# Patient Record
Sex: Female | Born: 1935 | Race: White | Hispanic: No | Marital: Married | Smoking: Never smoker
Health system: Southern US, Community
[De-identification: ages and names within clinical notes are randomized; demographics above are authoritative.]

## PROBLEM LIST (undated history)

## (undated) DIAGNOSIS — M5432 Sciatica, left side: Secondary | ICD-10-CM

## (undated) DIAGNOSIS — M25473 Effusion, unspecified ankle: Secondary | ICD-10-CM

## (undated) DIAGNOSIS — R51 Headache: Secondary | ICD-10-CM

## (undated) DIAGNOSIS — I10 Essential (primary) hypertension: Secondary | ICD-10-CM

## (undated) DIAGNOSIS — R609 Edema, unspecified: Secondary | ICD-10-CM

## (undated) DIAGNOSIS — IMO0001 Reserved for inherently not codable concepts without codable children: Secondary | ICD-10-CM

## (undated) DIAGNOSIS — Z8673 Personal history of transient ischemic attack (TIA), and cerebral infarction without residual deficits: Secondary | ICD-10-CM

## (undated) DIAGNOSIS — F419 Anxiety disorder, unspecified: Secondary | ICD-10-CM

## (undated) DIAGNOSIS — F331 Major depressive disorder, recurrent, moderate: Secondary | ICD-10-CM

## (undated) DIAGNOSIS — F0150 Vascular dementia without behavioral disturbance: Secondary | ICD-10-CM

## (undated) DIAGNOSIS — I2510 Atherosclerotic heart disease of native coronary artery without angina pectoris: Secondary | ICD-10-CM

## (undated) DIAGNOSIS — I2583 Coronary atherosclerosis due to lipid rich plaque: Secondary | ICD-10-CM

## (undated) DIAGNOSIS — N289 Disorder of kidney and ureter, unspecified: Secondary | ICD-10-CM

## (undated) DIAGNOSIS — Z5189 Encounter for other specified aftercare: Secondary | ICD-10-CM

## (undated) DIAGNOSIS — E7800 Pure hypercholesterolemia, unspecified: Secondary | ICD-10-CM

## (undated) DIAGNOSIS — IMO0002 Reserved for concepts with insufficient information to code with codable children: Secondary | ICD-10-CM

## (undated) HISTORY — PX: POLYPECTOMY: SHX149

## (undated) HISTORY — DX: Atherosclerotic heart disease of native coronary artery without angina pectoris: I25.10

## (undated) HISTORY — DX: Coronary atherosclerosis due to lipid rich plaque: I25.83

## (undated) HISTORY — PX: ABDOMINAL HYSTERECTOMY: SHX81

## (undated) HISTORY — DX: Effusion, unspecified ankle: M25.473

## (undated) HISTORY — PX: DILATION AND CURETTAGE OF UTERUS: SHX78

## (undated) HISTORY — DX: Vascular dementia without behavioral disturbance: F01.50

## (undated) HISTORY — DX: Major depressive disorder, recurrent, moderate: F33.1

## (undated) HISTORY — DX: Reserved for concepts with insufficient information to code with codable children: IMO0002

## (undated) HISTORY — PX: COLONOSCOPY: SHX174

## (undated) HISTORY — DX: Personal history of transient ischemic attack (TIA), and cerebral infarction without residual deficits: Z86.73

---

## 2001-05-15 ENCOUNTER — Encounter: Payer: Self-pay | Admitting: Obstetrics and Gynecology

## 2001-05-15 ENCOUNTER — Ambulatory Visit (HOSPITAL_COMMUNITY): Admission: RE | Admit: 2001-05-15 | Discharge: 2001-05-15 | Payer: Self-pay | Admitting: Obstetrics and Gynecology

## 2002-05-15 ENCOUNTER — Ambulatory Visit (HOSPITAL_COMMUNITY): Admission: RE | Admit: 2002-05-15 | Discharge: 2002-05-15 | Payer: Self-pay | Admitting: Unknown Physician Specialty

## 2002-05-15 ENCOUNTER — Encounter: Payer: Self-pay | Admitting: Unknown Physician Specialty

## 2004-07-20 ENCOUNTER — Ambulatory Visit: Payer: Self-pay | Admitting: Family Medicine

## 2005-11-10 ENCOUNTER — Ambulatory Visit: Payer: Self-pay | Admitting: Internal Medicine

## 2006-07-03 ENCOUNTER — Ambulatory Visit: Payer: Self-pay | Admitting: Internal Medicine

## 2006-07-20 ENCOUNTER — Ambulatory Visit: Payer: Self-pay | Admitting: Internal Medicine

## 2008-08-07 ENCOUNTER — Ambulatory Visit (HOSPITAL_COMMUNITY): Admission: RE | Admit: 2008-08-07 | Discharge: 2008-08-07 | Payer: Self-pay | Admitting: Ophthalmology

## 2008-08-20 ENCOUNTER — Ambulatory Visit (HOSPITAL_COMMUNITY): Admission: RE | Admit: 2008-08-20 | Discharge: 2008-08-20 | Payer: Self-pay | Admitting: Ophthalmology

## 2010-03-02 ENCOUNTER — Ambulatory Visit (HOSPITAL_COMMUNITY): Admission: RE | Admit: 2010-03-02 | Discharge: 2010-03-02 | Payer: Self-pay | Admitting: *Deleted

## 2010-11-01 LAB — BASIC METABOLIC PANEL
BUN: 25 mg/dL — ABNORMAL HIGH (ref 6–23)
CO2: 28 mEq/L (ref 19–32)
Calcium: 9.8 mg/dL (ref 8.4–10.5)
Chloride: 102 mEq/L (ref 96–112)
Creatinine, Ser: 0.95 mg/dL (ref 0.4–1.2)
GFR calc Af Amer: >60 mL/min (ref >60)
GFR calc non Af Amer: 57 mL/min — ABNORMAL LOW (ref >60)
Glucose, Bld: 142 mg/dL — ABNORMAL HIGH (ref 70–99)
Potassium: 4.2 mEq/L (ref 3.5–5.1)
Sodium: 137 mEq/L (ref 135–145)

## 2010-11-01 LAB — HEMOGLOBIN AND HEMATOCRIT, BLOOD
HCT: 37.9 % (ref 36.0–46.0)
Hemoglobin: 12.9 g/dL (ref 12.0–15.0)

## 2011-02-07 ENCOUNTER — Other Ambulatory Visit: Payer: Self-pay | Admitting: General Surgery

## 2011-02-07 ENCOUNTER — Emergency Department (HOSPITAL_COMMUNITY): Payer: Medicare Other

## 2011-02-07 ENCOUNTER — Other Ambulatory Visit: Payer: Self-pay

## 2011-02-07 ENCOUNTER — Inpatient Hospital Stay (HOSPITAL_COMMUNITY)
Admission: EM | Admit: 2011-02-07 | Discharge: 2011-02-12 | DRG: 339 | Disposition: A | Discharge status: Skilled Nursing Facility | Payer: Medicare Other | Attending: General Surgery | Admitting: General Surgery

## 2011-02-07 ENCOUNTER — Encounter (HOSPITAL_COMMUNITY): Payer: Self-pay | Admitting: Anesthesiology

## 2011-02-07 ENCOUNTER — Emergency Department (HOSPITAL_COMMUNITY): Payer: Medicare Other | Admitting: Anesthesiology

## 2011-02-07 ENCOUNTER — Encounter (HOSPITAL_COMMUNITY)
Admission: EM | Disposition: A | Discharge status: Skilled Nursing Facility | Payer: Self-pay | Source: Home / Self Care | Attending: General Surgery

## 2011-02-07 DIAGNOSIS — K352 Acute appendicitis with generalized peritonitis, without abscess: Principal | ICD-10-CM

## 2011-02-07 DIAGNOSIS — E871 Hypo-osmolality and hyponatremia: Secondary | ICD-10-CM | POA: Diagnosis present

## 2011-02-07 DIAGNOSIS — K35209 Acute appendicitis with generalized peritonitis, without abscess, unspecified as to perforation: Principal | ICD-10-CM | POA: Diagnosis present

## 2011-02-07 DIAGNOSIS — E119 Type 2 diabetes mellitus without complications: Secondary | ICD-10-CM | POA: Diagnosis present

## 2011-02-07 DIAGNOSIS — R339 Retention of urine, unspecified: Secondary | ICD-10-CM | POA: Diagnosis not present

## 2011-02-07 DIAGNOSIS — R5381 Other malaise: Secondary | ICD-10-CM | POA: Diagnosis present

## 2011-02-07 DIAGNOSIS — E7800 Pure hypercholesterolemia, unspecified: Secondary | ICD-10-CM | POA: Diagnosis present

## 2011-02-07 HISTORY — DX: Anxiety disorder, unspecified: F41.9

## 2011-02-07 HISTORY — DX: Reserved for inherently not codable concepts without codable children: IMO0001

## 2011-02-07 HISTORY — DX: Edema, unspecified: R60.9

## 2011-02-07 HISTORY — DX: Pure hypercholesterolemia, unspecified: E78.00

## 2011-02-07 HISTORY — DX: Headache: R51

## 2011-02-07 HISTORY — DX: Encounter for other specified aftercare: Z51.89

## 2011-02-07 HISTORY — PX: LAPAROSCOPIC APPENDECTOMY: SHX408

## 2011-02-07 LAB — URINALYSIS, ROUTINE W REFLEX MICROSCOPIC
Glucose, UA: NEGATIVE mg/dL
Leukocytes, UA: NEGATIVE
pH: 5.5 (ref 5.0–8.0)

## 2011-02-07 LAB — CBC
MCH: 30.3 pg (ref 26.0–34.0)
MCHC: 35 g/dL (ref 30.0–36.0)
Platelets: 249 10*3/uL (ref 150–400)
RBC: 4.16 MIL/uL (ref 3.87–5.11)

## 2011-02-07 LAB — COMPREHENSIVE METABOLIC PANEL
ALT: 14 U/L (ref 0–35)
Albumin: 3.4 g/dL — ABNORMAL LOW (ref 3.5–5.2)
Alkaline Phosphatase: 43 U/L (ref 39–117)
Potassium: 2.9 mEq/L — ABNORMAL LOW (ref 3.5–5.1)
Sodium: 133 mEq/L — ABNORMAL LOW (ref 135–145)
Total Protein: 7.5 g/dL (ref 6.0–8.3)

## 2011-02-07 LAB — GLUCOSE, CAPILLARY: Glucose-Capillary: 168 mg/dL — ABNORMAL HIGH (ref 70–99)

## 2011-02-07 LAB — DIFFERENTIAL
Basophils Relative: 0 % (ref 0–1)
Eosinophils Absolute: 0 10*3/uL (ref 0.0–0.7)
Lymphs Abs: 1.7 10*3/uL (ref 0.7–4.0)
Neutrophils Relative %: 78 % — ABNORMAL HIGH (ref 43–77)

## 2011-02-07 SURGERY — APPENDECTOMY, LAPAROSCOPIC
Anesthesia: General | Wound class: Contaminated

## 2011-02-07 MED ORDER — SODIUM CHLORIDE 0.9 % IR SOLN
Status: DC | PRN
  Administered 2011-02-07: 1000 mL

## 2011-02-07 MED ORDER — ROSUVASTATIN CALCIUM 20 MG PO TABS: 20.0000 mg | ORAL_TABLET | Freq: Every day | ORAL | Status: DC

## 2011-02-07 MED ORDER — LACTATED RINGERS IV SOLN
INTRAVENOUS | Status: DC
  Administered 2011-02-07 (×2): via INTRAVENOUS

## 2011-02-07 MED ORDER — SODIUM CHLORIDE 0.9 % IV SOLN
1.0000 g | INTRAVENOUS | Status: DC
  Administered 2011-02-08 – 2011-02-11 (×4): 1 g via INTRAVENOUS
  Filled 2011-02-07 (×7): qty 1

## 2011-02-07 MED ORDER — POTASSIUM CHLORIDE 10 MEQ/100ML IV SOLN
10.0000 meq | Freq: Once | INTRAVENOUS | Status: AC
  Administered 2011-02-07: 10 meq via INTRAVENOUS
  Filled 2011-02-07: qty 100

## 2011-02-07 MED ORDER — SODIUM CHLORIDE 0.9 % IV SOLN
INTRAVENOUS | Status: AC
  Filled 2011-02-07: qty 1

## 2011-02-07 MED ORDER — HYDROMORPHONE HCL 1 MG/ML IJ SOLN
1.0000 mg | Freq: Once | INTRAMUSCULAR | Status: AC
  Administered 2011-02-07: 1 mg via INTRAVENOUS
  Filled 2011-02-07: qty 1

## 2011-02-07 MED ORDER — SODIUM CHLORIDE 0.9 % IR SOLN
Status: DC | PRN
  Administered 2011-02-07: 3000 mL

## 2011-02-07 MED ORDER — ONDANSETRON HCL 4 MG PO TABS
4.0000 mg | ORAL_TABLET | Freq: Four times a day (QID) | ORAL | Status: DC | PRN
  Administered 2011-02-09: 4 mg via ORAL
  Filled 2011-02-07: qty 1

## 2011-02-07 MED ORDER — POTASSIUM CHLORIDE IN NACL 20-0.9 MEQ/L-% IV SOLN
INTRAVENOUS | Status: DC
  Administered 2011-02-07: 23:00:00 via INTRAVENOUS
  Administered 2011-02-08 (×2): 1000 mL via INTRAVENOUS
  Administered 2011-02-09 – 2011-02-11 (×4): via INTRAVENOUS

## 2011-02-07 MED ORDER — GLYCOPYRROLATE 0.2 MG/ML IJ SOLN
INTRAMUSCULAR | Status: DC | PRN
  Administered 2011-02-07 (×2): 0.2 mg via INTRAVENOUS

## 2011-02-07 MED ORDER — PANTOPRAZOLE SODIUM 40 MG PO TBEC
40.0000 mg | DELAYED_RELEASE_TABLET | Freq: Every day | ORAL | Status: DC
  Administered 2011-02-08 – 2011-02-11 (×4): 40 mg via ORAL
  Filled 2011-02-07 (×4): qty 1

## 2011-02-07 MED ORDER — SODIUM CHLORIDE 0.9 % IV SOLN: 20.0000 mL | INTRAVENOUS | Status: DC

## 2011-02-07 MED ORDER — SODIUM CHLORIDE 0.9 % IV SOLN
1.0000 g | INTRAVENOUS | Status: DC
  Filled 2011-02-07: qty 1

## 2011-02-07 MED ORDER — METFORMIN HCL 500 MG PO TABS
500.0000 mg | ORAL_TABLET | Freq: Every day | ORAL | Status: DC
  Administered 2011-02-08 – 2011-02-12 (×5): 500 mg via ORAL
  Filled 2011-02-07 (×5): qty 1

## 2011-02-07 MED ORDER — SODIUM CHLORIDE 0.9 % IV SOLN
1.0000 g | Freq: Once | INTRAVENOUS | Status: AC
  Administered 2011-02-07: 1 g via INTRAVENOUS
  Filled 2011-02-07: qty 1

## 2011-02-07 MED ORDER — ONDANSETRON HCL 4 MG/2ML IJ SOLN
4.0000 mg | Freq: Once | INTRAMUSCULAR | Status: AC
  Administered 2011-02-07: 4 mg via INTRAVENOUS
  Filled 2011-02-07: qty 2

## 2011-02-07 MED ORDER — ALPRAZOLAM 0.25 MG PO TABS
0.2500 mg | ORAL_TABLET | Freq: Two times a day (BID) | ORAL | Status: DC
  Administered 2011-02-07 – 2011-02-12 (×10): 0.25 mg via ORAL
  Filled 2011-02-07 (×10): qty 1

## 2011-02-07 MED ORDER — ROCURONIUM BROMIDE 100 MG/10ML IV SOLN
INTRAVENOUS | Status: DC | PRN
  Administered 2011-02-07: 15 mg via INTRAVENOUS
  Administered 2011-02-07: 5 mg via INTRAVENOUS

## 2011-02-07 MED ORDER — BIOTENE DRY MOUTH MT LIQD
Freq: Two times a day (BID) | OROMUCOSAL | Status: DC
  Administered 2011-02-07 – 2011-02-08 (×2): via OROMUCOSAL
  Administered 2011-02-08: 1 via OROMUCOSAL
  Administered 2011-02-09 (×2): via OROMUCOSAL
  Administered 2011-02-10: 1 via OROMUCOSAL
  Administered 2011-02-10 – 2011-02-12 (×4): via OROMUCOSAL

## 2011-02-07 MED ORDER — ROSUVASTATIN CALCIUM 20 MG PO TABS
20.0000 mg | ORAL_TABLET | Freq: Every day | ORAL | Status: DC
  Administered 2011-02-07 – 2011-02-11 (×5): 20 mg via ORAL
  Filled 2011-02-07 (×5): qty 1

## 2011-02-07 MED ORDER — CALCIUM CARBONATE-VITAMIN D 500-200 MG-UNIT PO TABS
1.0000 | ORAL_TABLET | Freq: Every day | ORAL | Status: DC
  Administered 2011-02-07 – 2011-02-12 (×6): 1 via ORAL
  Filled 2011-02-07 (×6): qty 1

## 2011-02-07 MED ORDER — ONDANSETRON HCL 4 MG/2ML IJ SOLN: 4.0000 mg | Freq: Four times a day (QID) | INTRAMUSCULAR | Status: DC | PRN

## 2011-02-07 MED ORDER — SODIUM CHLORIDE 0.9 % IV SOLN
Freq: Once | INTRAVENOUS | Status: AC
  Administered 2011-02-07: 13:00:00 via INTRAVENOUS

## 2011-02-07 MED ORDER — SODIUM CHLORIDE 0.9 % IJ SOLN
INTRAMUSCULAR | Status: AC
  Administered 2011-02-07: 16:00:00
  Filled 2011-02-07: qty 10

## 2011-02-07 MED ORDER — ALPRAZOLAM 0.25 MG PO TABS: 0.2500 mg | ORAL_TABLET | Freq: Two times a day (BID) | ORAL | Status: DC

## 2011-02-07 MED ORDER — ACETAMINOPHEN 10 MG/ML IV SOLN
1000.0000 mg | Freq: Four times a day (QID) | INTRAVENOUS | Status: DC
  Administered 2011-02-07: 1000 mg via INTRAVENOUS

## 2011-02-07 MED ORDER — NEOSTIGMINE METHYLSULFATE 1 MG/ML IJ SOLN
INTRAMUSCULAR | Status: DC | PRN
  Administered 2011-02-07 (×2): 1.25 mg via INTRAMUSCULAR

## 2011-02-07 MED ORDER — INSULIN ASPART 100 UNIT/ML ~~LOC~~ SOLN
0.0000 [IU] | SUBCUTANEOUS | Status: DC
  Administered 2011-02-08: 3 [IU] via SUBCUTANEOUS
  Administered 2011-02-08 (×2): 2 [IU] via SUBCUTANEOUS
  Filled 2011-02-07: qty 3

## 2011-02-07 MED ORDER — PROPOFOL 10 MG/ML IV EMUL
INTRAVENOUS | Status: DC | PRN
  Administered 2011-02-07: 100 mg via INTRAVENOUS
  Administered 2011-02-07: 20 mg via INTRAVENOUS

## 2011-02-07 MED ORDER — ENOXAPARIN SODIUM 40 MG/0.4ML ~~LOC~~ SOLN
40.0000 mg | SUBCUTANEOUS | Status: DC
  Administered 2011-02-08 – 2011-02-11 (×4): 40 mg via SUBCUTANEOUS
  Filled 2011-02-07 (×4): qty 0.4

## 2011-02-07 MED ORDER — MORPHINE SULFATE 2 MG/ML IJ SOLN
2.0000 mg | INTRAMUSCULAR | Status: DC | PRN
  Administered 2011-02-07 – 2011-02-08 (×3): 2 mg via INTRAVENOUS
  Filled 2011-02-07 (×3): qty 1

## 2011-02-07 MED ORDER — FENTANYL CITRATE 0.05 MG/ML IJ SOLN
INTRAMUSCULAR | Status: DC | PRN
  Administered 2011-02-07 (×5): 50 ug via INTRAVENOUS

## 2011-02-07 MED ORDER — ONDANSETRON HCL 4 MG/2ML IJ SOLN
INTRAMUSCULAR | Status: DC | PRN
  Administered 2011-02-07: 4 mg via INTRAVENOUS

## 2011-02-07 MED ORDER — SUCCINYLCHOLINE CHLORIDE 20 MG/ML IJ SOLN
INTRAMUSCULAR | Status: DC | PRN
  Administered 2011-02-07: 100 mg via INTRAVENOUS

## 2011-02-07 MED ORDER — INSULIN ASPART 100 UNIT/ML ~~LOC~~ SOLN: 0.0000 [IU] | Freq: Every day | SUBCUTANEOUS | Status: DC

## 2011-02-07 MED ORDER — IOHEXOL 300 MG/ML  SOLN
100.0000 mL | Freq: Once | INTRAMUSCULAR | Status: AC | PRN
  Administered 2011-02-07: 100 mL via INTRAVENOUS

## 2011-02-07 MED ORDER — BUPIVACAINE HCL 0.5 % IJ SOLN
INTRAMUSCULAR | Status: DC | PRN
  Administered 2011-02-07: 10 mL

## 2011-02-07 MED ORDER — KCL-LACTATED RINGERS 20 MEQ/L IV SOLN
INTRAVENOUS | Status: DC
  Filled 2011-02-07 (×2): qty 1000

## 2011-02-07 MED ORDER — ENOXAPARIN SODIUM 80 MG/0.8ML ~~LOC~~ SOLN
40.0000 mg | Freq: Once | SUBCUTANEOUS | Status: AC
  Administered 2011-02-07: 40 mg via SUBCUTANEOUS
  Filled 2011-02-07: qty 0.8

## 2011-02-07 SURGICAL SUPPLY — 44 items
BAG HAMPER (MISCELLANEOUS) ×2 IMPLANT
CLOTH BEACON ORANGE TIMEOUT ST (SAFETY) ×2 IMPLANT
COVER LIGHT HANDLE STERIS (MISCELLANEOUS) ×4 IMPLANT
CUTTER ENDO LINEAR 45M (STAPLE) ×2 IMPLANT
CUTTER LINEAR ENDO 35 ETS (STAPLE) IMPLANT
CUTTER LINEAR ENDO 35 ETS TH (STAPLE) IMPLANT
DECANTER SPIKE VIAL GLASS SM (MISCELLANEOUS) IMPLANT
DISSECTOR BLUNT TIP ENDO 5MM (MISCELLANEOUS) IMPLANT
DURAPREP 26ML APPLICATOR (WOUND CARE) ×2 IMPLANT
ELECT REM PT RETURN 9FT ADLT (ELECTROSURGICAL) ×2
ELECTRODE REM PT RTRN 9FT ADLT (ELECTROSURGICAL) ×1 IMPLANT
EVACUATOR DRAINAGE 10X20 100CC (DRAIN) ×1 IMPLANT
EVACUATOR SILICONE 100CC (DRAIN) ×1
FILTER SMOKE EVAC LAPAROSHD (FILTER) ×2 IMPLANT
FORMALIN 10 PREFIL 120ML (MISCELLANEOUS) ×2 IMPLANT
GLOVE BIO SURGEON STRL SZ7.5 (GLOVE) ×2 IMPLANT
GOWN BRE IMP SLV AUR XL STRL (GOWN DISPOSABLE) ×4 IMPLANT
HEMOSTAT SURGICEL 4X8 (HEMOSTASIS) ×2 IMPLANT
INST SET LAPROSCOPIC AP (KITS) ×2 IMPLANT
IV NS IRRIG 3000ML ARTHROMATIC (IV SOLUTION) ×2 IMPLANT
KIT ROOM TURNOVER APOR (KITS) ×2 IMPLANT
MANIFOLD NEPTUNE II (INSTRUMENTS) ×2 IMPLANT
NEEDLE INSUFFLATION 14GA 120MM (NEEDLE) ×2 IMPLANT
NS IRRIG 1000ML POUR BTL (IV SOLUTION) ×2 IMPLANT
PACK LAP CHOLE LZT030E (CUSTOM PROCEDURE TRAY) ×2 IMPLANT
PAD ARMBOARD 7.5X6 YLW CONV (MISCELLANEOUS) ×2 IMPLANT
POUCH SPECIMEN RETRIEVAL 10MM (ENDOMECHANICALS) ×2 IMPLANT
RELOAD /EVU35 (ENDOMECHANICALS) IMPLANT
RELOAD 45 VASCULAR/THIN (ENDOMECHANICALS) ×2 IMPLANT
RELOAD CUTTER ETS 35MM STAND (ENDOMECHANICALS) IMPLANT
SCALPEL HARMONIC ACE (MISCELLANEOUS) ×2 IMPLANT
SET BASIN LINEN APH (SET/KITS/TRAYS/PACK) ×2 IMPLANT
SET TUBE IRRIG SUCTION NO TIP (IRRIGATION / IRRIGATOR) ×2 IMPLANT
SLEEVE Z-THREAD 5X100MM (TROCAR) ×2 IMPLANT
SPONGE GAUZE 2X2 8PLY STRL LF (GAUZE/BANDAGES/DRESSINGS) ×6 IMPLANT
STAPLER VISISTAT (STAPLE) ×2 IMPLANT
SUT ETHILON 3 0 FSL (SUTURE) ×2 IMPLANT
SUT VICRYL 0 UR6 27IN ABS (SUTURE) ×2 IMPLANT
TRAY FOLEY CATH 14FR (SET/KITS/TRAYS/PACK) ×2 IMPLANT
TROCAR Z-THAD FIOS HNDL 12X100 (TROCAR) ×2 IMPLANT
TROCAR Z-THRD FIOS HNDL 11X100 (TROCAR) ×2 IMPLANT
TROCAR Z-THREAD FIOS 5X100MM (TROCAR) ×2 IMPLANT
WARMER LAPAROSCOPE (MISCELLANEOUS) ×2 IMPLANT
YANKAUER SUCT BULB TIP 10FT TU (MISCELLANEOUS) ×2 IMPLANT

## 2011-02-07 NOTE — Op Note (Signed)
Patient: Kendra White  DOB: [birth year]  Preoperative diagnosis: Acute appendicitis with perforation  Postoperative diagnosis: Same  Procedure: Laparoscopic appendectomy  Surgeon: Dr. Franky Macho  Anesthesia: Gen. endotracheal  Indications: Patient is a 77 year old white female presents with a three-day history of worsening lower abdominal pain. CT scan of the abdomen and pelvis revealed acute appendicitis with perforation. The patient now comes to the operating room for a laparoscopic appendectomy. The risks and benefits of the procedure including bleeding, infection, and the possibility of an open procedure were fully explained to the patient, gave informed consent.  Procedure note: Patient was placed in the supine position. After induction of general endotracheal anesthesia, the abdomen was prepped and draped using the usual sterile technique with DuraPrep. Surgical site confirmation was performed.  A supraumbilical incision was made down the fascia. A Veress needle is introduced into the abdominal cavity and confirmation of placement was done using the saline drop test. The abdomen was then insufflated to 16 mm mercury pressure. An 11 mm trocar was introduced into the abdominal cavity under direct visualization without difficulty. The patient was placed in deeper Trendelenburg position and additional 12 mm trocar was placed the suprapubic region while 5 mm trochars were placed the right lower quadrant and left lower quadrant regions.000 the appendix was visualized and noted to be extending into the pelvis. Localized perforation was noted. The mesoappendix was divided using the harmonic scalpel. A standard Endo GIA was placed across the base of the appendix and fired. The appendix was removed using an Endo Catch bag without difficulty. Sent to pathology further examination. A small piece of Surgicel was placed along the staple line. The right lower quadrant was copiously are irrigated with normal  saline. A #10 flat Jackson-Pratt drain was placed into this region and brought through the right lower quadrant 5 mm trocar site. It was secured in place at the skin level level using 3-0 nylon interrupted suture. All fluid and air were then evacuated from the abdominal cavity prior to removal of the trochars.  All wounds were irrigated with normal saline. All wounds were injected with 0.5% Sensorcaine. The supraumbilical as well as suprapubic fascia were reapproximated using 0 Vicryl interrupted sutures. All skin incisions were closed using staples. Betadine ointment dry sterile dressings were applied.  All tape and needle counts were correct at the end of the procedure.  The patient was extubated in the operating room and went back recovery room in stable condition.  Complications: None  Specimen: Appendix  Blood loss: Minimal  Drains: JP to right lower quadrant

## 2011-02-07 NOTE — Transfer of Care (Signed)
Immediate Anesthesia Transfer of Care Note  Patient: Kendra White  Procedure(s) Performed:  APPENDECTOMY LAPAROSCOPIC  Patient Location: PACU  Anesthesia Type: General  Level of Consciousness: awake and alert   Airway & Oxygen Therapy: Patient Spontanous Breathing and Patient connected to face mask oxygen  Post-op Assessment: Report given to PACU RN, Post -op Vital signs reviewed and stable and Patient moving all extremities  Post vital signs: Reviewed and stable  Complications: No apparent anesthesia complications

## 2011-02-07 NOTE — ED Notes (Signed)
Pt c/o rt lower abd/groin pain since last week. Pt reports pain as being a throbbing/aching pain with nausea as well. Nothing relieves and movement makes pain worse. Decreased appetite with pain. Denies any difficulties with bowel/bladder.

## 2011-02-07 NOTE — H&P (Signed)
Subjective:   Patient is a 77 y.o. female presents with right lower quadrant abdominal pain. Onset of symptoms was 3 days ago with shifting to the right lower quadrant the abdomen  since that time. The pain is located now in the right lower quadrant. Patient describes the pain as continuous and rated as moderate. Pain has been associated with movement. Patient denies nausea or vomiting. Symptoms are aggravated by movement.  CT scan of the abdomen and pelvis reveals acute appendicitis with possible localized perforation. Cholelithiasis is also noted.  There are no active problems to display for this patient.  Past Medical History  Diagnosis Date  . Diabetes mellitus   . Dependent edema   . Hypercholesteremia     Past Surgical History  Procedure Date  . Abdominal hysterectomy      (Not in a hospital admission) Allergies  Allergen Reactions  . Calamine Hives  . Benadryl Allergy Rash    History  Substance Use Topics  . Smoking status: Never Smoker   . Smokeless tobacco: Not on file  . Alcohol Use: No    No family history on file.  Review of Systems A comprehensive review of systems was negative except for: Gastrointestinal: positive for abdominal pain  Objective:   Patient Vitals for the past 8 hrs:  BP Temp Temp src Pulse Resp SpO2 Height Weight  02/07/11 1532 134/49 mmHg 100.2 F (37.9 C) Oral 90  15  95 % - -  02/07/11 1336 143/54 mmHg 98.5 F (36.9 C) - 88  16  95 % - -  02/07/11 1139 135/62 mmHg 98.6 F (37 C) Oral 108  16  100 % 5\' 2"  (1.575 m) 74.39 kg (164 lb)       well-developed and well-nourished white female in no acute distress.  HEENT exam: Unremarkable  Lungs: Clear to auscultation with equal breath sounds bilaterally.  Heart: Regular rate and rhythm without S3, S4, or murmurs.  Abdomen: Soft with tenderness noted in the right lower quadrant over McBurney's point. No hepatosplenomegaly, masses, or hernias identified.      .  Data Review:    Current lab results:  on chart, potassium 2.9 Basename 02/07/11 1153  WBC 15.7*  HGB 12.6  HCT 36.0  . Assessment:   Acute appendicitis  Plan:   We'll take to the operating room for a laparoscopic appendectomy. The risks and benefits of the procedure including bleeding, infection, and a possibly of an open procedure were fully explained to the patient, gave informed consent.

## 2011-02-07 NOTE — ED Notes (Signed)
Pt left for surgery with Kathie Rhodes RN

## 2011-02-07 NOTE — ED Provider Notes (Signed)
History    Written by Enos Fling acting as scribe for Benny Lennert, MD.  Chief Complaint  Patient presents with  . Abdominal Pain   HPI Comments: Pt reports vaginal hysterectomy at age 77, ovaries not removed.   Patient is a 77 y.o. female presenting with abdominal pain. The history is provided by the patient.  Abdominal Pain The primary symptoms of the illness include abdominal pain and nausea. The primary symptoms of the illness do not include fatigue, vomiting, diarrhea or dysuria. The current episode started 2 days ago (pain onset 3 days ago but became much worse 2 days ago and has persisted since then). The onset of the illness was sudden.  Symptoms associated with the illness do not include chills, diaphoresis, constipation, urgency, hematuria, frequency or back pain.    Past Medical History  Diagnosis Date  . Diabetes mellitus   . Dependent edema   . Hypercholesteremia     Past Surgical History  Procedure Date  . Abdominal hysterectomy     No family history on file.  History  Substance Use Topics  . Smoking status: Never Smoker   . Smokeless tobacco: Not on file  . Alcohol Use: No    OB History    Grav Para Term Preterm Abortions TAB SAB Ect Mult Living                  Review of Systems  Constitutional: Positive for appetite change. Negative for chills, diaphoresis and fatigue.  HENT: Negative for congestion, sinus pressure and ear discharge.   Eyes: Negative for discharge.  Respiratory: Negative for cough.   Cardiovascular: Negative for chest pain.  Gastrointestinal: Positive for nausea and abdominal pain. Negative for vomiting, diarrhea and constipation.  Genitourinary: Negative for dysuria, urgency, frequency, hematuria and flank pain.  Musculoskeletal: Negative for back pain.  Skin: Negative for rash.  Neurological: Negative for seizures and headaches.  Hematological: Negative.   Psychiatric/Behavioral: Negative for hallucinations.     Physical Exam  BP 135/62  Pulse 108  Temp(Src) 98.6 F (37 C) (Oral)  Resp 16  Ht 5\' 2"  (1.575 m)  Wt 164 lb (74.39 kg)  BMI 30.00 kg/m2  SpO2 100%  Physical Exam  Constitutional: She is oriented to person, place, and time. She appears well-developed.  HENT:  Head: Normocephalic and atraumatic.  Eyes: Conjunctivae and EOM are normal. No scleral icterus.  Neck: Neck supple. No thyromegaly present.  Cardiovascular: Normal rate and regular rhythm.  Exam reveals no gallop and no friction rub.   No murmur heard. Pulmonary/Chest: No stridor. She has no wheezes. She has no rales. She exhibits no tenderness.  Abdominal: Soft. She exhibits no distension. There is tenderness. There is rebound.       RLQ tenderness  Musculoskeletal: Normal range of motion. She exhibits no edema.  Lymphadenopathy:    She has no cervical adenopathy.  Neurological: She is oriented to person, place, and time. Coordination normal.  Skin: No rash noted. No erythema.  Psychiatric: She has a normal mood and affect. Her behavior is normal.    ED Course  Procedures  Results for orders placed during the hospital encounter of 02/07/11  CBC      Component Value Range   WBC 15.7 (*) 4.0 - 10.5 (K/uL)   RBC 4.16  3.87 - 5.11 (MIL/uL)   Hemoglobin 12.6  12.0 - 15.0 (g/dL)   HCT 16.1  09.6 - 04.5 (%)   MCV 86.5  78.0 - 100.0 (fL)  MCH 30.3  26.0 - 34.0 (pg)   MCHC 35.0  30.0 - 36.0 (g/dL)   RDW 40.9  81.1 - 91.4 (%)   Platelets 249  150 - 400 (K/uL)  DIFFERENTIAL      Component Value Range   Neutrophils Relative 78 (*) 43 - 77 (%)   Neutro Abs 12.3 (*) 1.7 - 7.7 (K/uL)   Lymphocytes Relative 11 (*) 12 - 46 (%)   Lymphs Abs 1.7  0.7 - 4.0 (K/uL)   Monocytes Relative 11  3 - 12 (%)   Monocytes Absolute 1.7 (*) 0.1 - 1.0 (K/uL)   Eosinophils Relative 0  0 - 5 (%)   Eosinophils Absolute 0.0  0.0 - 0.7 (K/uL)   Basophils Relative 0  0 - 1 (%)   Basophils Absolute 0.0  0.0 - 0.1 (K/uL)  COMPREHENSIVE  METABOLIC PANEL      Component Value Range   Sodium 133 (*) 135 - 145 (mEq/L)   Potassium 2.9 (*) 3.5 - 5.1 (mEq/L)   Chloride 95 (*) 96 - 112 (mEq/L)   CO2 25  19 - 32 (mEq/L)   Glucose, Bld 190 (*) 70 - 99 (mg/dL)   BUN 18  6 - 23 (mg/dL)   Creatinine, Ser 7.82  0.50 - 1.10 (mg/dL)   Calcium 9.5  8.4 - 95.6 (mg/dL)   Total Protein 7.5  6.0 - 8.3 (g/dL)   Albumin 3.4 (*) 3.5 - 5.2 (g/dL)   AST 19  0 - 37 (U/L)   ALT 14  0 - 35 (U/L)   Alkaline Phosphatase 43  39 - 117 (U/L)   Total Bilirubin 0.7  0.3 - 1.2 (mg/dL)   GFR calc non Af Amer >60  >60 (mL/min)   GFR calc Af Amer >60  >60 (mL/min)  URINALYSIS, ROUTINE W REFLEX MICROSCOPIC      Component Value Range   Color, Urine AMBER (*) YELLOW    Appearance CLEAR  CLEAR    Specific Gravity, Urine >1.030 (*) 1.005 - 1.030    pH 5.5  5.0 - 8.0    Glucose, UA NEGATIVE  NEGATIVE (mg/dL)   Hgb urine dipstick TRACE (*) NEGATIVE    Bilirubin Urine SMALL (*) NEGATIVE    Ketones, ur TRACE (*) NEGATIVE (mg/dL)   Protein, ur 213 (*) NEGATIVE (mg/dL)   Urobilinogen, UA 1.0  0.0 - 1.0 (mg/dL)   Nitrite NEGATIVE  NEGATIVE    Leukocytes, UA NEGATIVE  NEGATIVE   URINE MICROSCOPIC-ADD ON      Component Value Range   Squamous Epithelial / LPF FEW (*) RARE    WBC, UA 3-6  <3 (WBC/hpf)   Ct Abdomen Pelvis W Contrast  02/07/2011  *RADIOLOGY REPORT*  Clinical Data: Abdominal pain.  Diabetes.  Prior hysterectomy.  CT ABDOMEN AND PELVIS WITH CONTRAST  Technique:  Multidetector CT imaging of the abdomen and pelvis was performed following the standard protocol during bolus administration of intravenous contrast.  Contrast: 100 ml Omnipaque-300  Comparison: None.  Findings: Aortic and mitral annular calcification noted.  Multiple gallstones are present.  The largest is 1.1 cm and in maximum length.  No biliary dilatation or directly visualized choledocholithiasis.  I suspect mild diffuse hepatic steatosis.  A focal density in the ascending colon probably  represents a pill fragment.  The spleen, pancreas, and adrenal glands appear normal.  The kidneys appear unremarkable, as do the proximal ureters.  Faint density adjacent to the right distal ureter is thought to be due to  a phlebolith.  Acute appendicitis is present, with appendiceal diameter at 1.4 cm in diameter, and extensive periappendiceal stranding. Appendix with appendicolith noted, and there is reactive abnormal wall thickening in an adjacent loop of the sigmoid colon.  I suspect a tiny locule of extraluminal gas on image 41 of series 4.  Accordingly there may be some localized rupture of the appendix.  No gross free intraperitoneal gas identified.  The inflamed appendix sits just above the right ovary, which may be secondarily mildly inflamed.  Left ovary appears unremarkable.  The uterus is absent.  The urinary bladder appears normal.  IMPRESSION: 1.  Prominent acute appendicitis.  Possible tiny locule of extraluminal gas adjacent to the appendix. 2.  There is secondary inflammation of the adjacent sigmoid colon, and possibly of the adjacent right ovary. 3.  Cholelithiasis. 4.  Aortic and mitral valve calcifications. 5.  Diffuse hepatic steatosis (mild).  I discussed these findings by telephone with Dr. Bethann Berkshire on 02/07/2011 at 2:37 p.m.  Original Report Authenticated By: Dellia Cloud, M.D.    2:44 PM Pt recheck: pt still with some pain, discussed work up findings and plan for admission for surgery, pt understands and agrees. Will call Dr. Lovell Sheehan for surgery consult.  3:05 PM Spoke with Dr. Lovell Sheehan, will accept pt.  MDM RLQ pain with rebound tenderness and associated nausea x 2 days, c/w acute appendicitis; CT abd/pelvis confirms acute appendicitis with possible small rupture, discussed results with radiologist at 14:37; will admit to Dr. Lovell Sheehan for appendectomy.      Benny Lennert, MD 02/07/11 1520

## 2011-02-07 NOTE — ED Notes (Signed)
House Supervisor Clista Bernhardt paged for [employer]

## 2011-02-07 NOTE — Anesthesia Preprocedure Evaluation (Addendum)
Anesthesia Evaluation  Name, MR# and DOB Patient awake  General Assessment Comment  Reviewed: Allergy & Precautions, H&P , Patient's Chart, lab work & pertinent test results and reviewed documented beta blocker date and time   History of Anesthesia Complications Negative for: history of anesthetic complications  Airway Mallampati: I TM Distance: <3 FB Neck ROM: Full    Dental  (+) Edentulous Upper and Partial Lower   Pulmonaryneg pulmonary ROS      pulmonary exam normal   Cardiovascular Exercise Tolerance: Good Regular Normal   Neuro/PsychNegative Neurological ROS Negative Psych ROS  GI/Hepatic/Renal negative GI ROS, negative Liver ROS, and negative Renal ROS (+)       Endo/Other   (+) Diabetes mellitus-  Abdominal (+)  Abdomen: tender.    Musculoskeletal negative musculoskeletal ROS (+)  Hematology negative hematology ROS (+)   Peds negative pediatric ROS (+)  Reproductive/Obstetrics negative OB ROS   Anesthesia Other Findings Hyperchlorestrolemia Pedal Edema History            Anesthesia Physical Anesthesia Plan  ASA: III and Emergent  Anesthesia Plan: General   Post-op Pain Management:    Induction: Rapid sequence and Cricoid pressure planned  Airway Management Planned: Oral ETT  Additional Equipment:   Intra-op Plan:   Post-operative Plan: Extubation in OR  Informed Consent:   Dental advisory given  Plan Discussed with: Anesthesiologist (AP)  Anesthesia Plan Comments:         Anesthesia Quick Evaluation  Saw Patient with Husband in ER # 19. Stable. Questions answered. Will proceed to OR when Ready

## 2011-02-07 NOTE — ED Notes (Signed)
Pt waiting to see Dr. Lovell Sheehan for surgery. Family at [facility]. nad noted.

## 2011-02-07 NOTE — Progress Notes (Signed)
Pt does have a health care power of attorney- Ivin Booty- daughter-

## 2011-02-07 NOTE — Anesthesia Postprocedure Evaluation (Signed)
  Anesthesia Post-op Note  Patient: Kendra White  Procedure(s) Performed:  APPENDECTOMY LAPAROSCOPIC  Patient Location: PACU  Anesthesia Type: General  Level of Consciousness: awake, alert  and oriented  Airway and Oxygen Therapy: Patient Spontanous Breathing and Patient connected to face mask oxygen  Post-op Pain: none, Patient Remarkably Comfortable on 250 mcg Of Fentanyl.  Post-op Assessment: Post-op Vital signs reviewed, Patient's Cardiovascular Status Stable and Respiratory Function Stable  Post-op Vital Signs: Reviewed and stable  Complications: No apparent anesthesia complications

## 2011-02-07 NOTE — Anesthesia Procedure Notes (Addendum)
Procedure Name: Intubation Date/Time: 02/07/2011 5:42 PM Performed by: Marylene Buerger Pre-anesthesia Checklist: Patient identified, Patient being monitored and Suction available Patient Re-evaluated:Patient Re-evaluated prior to inductionOxygen Delivery Method: Circle System Utilized Preoxygenation: Pre-oxygenation with 100% oxygen Intubation Type: IV induction, Rapid sequence and Circoid Pressure applied Laryngoscope Size: Mac and 3 Grade View: Grade I Tube type: Oral Tube size: 7.0 mm Placement Confirmation: ETT inserted through vocal cords under direct vision,  positive ETCO2 and breath sounds checked- equal and bilateral Secured at: 21 cm Dental Injury: Teeth and Oropharynx as per pre-operative assessment

## 2011-02-08 LAB — CBC
HCT: 31.7 % — ABNORMAL LOW (ref 36.0–46.0)
MCV: 87.3 fL (ref 78.0–100.0)
Platelets: 248 10*3/uL (ref 150–400)
RBC: 3.63 MIL/uL — ABNORMAL LOW (ref 3.87–5.11)
RDW: 14 % (ref 11.5–15.5)
WBC: 6.5 10*3/uL (ref 4.0–10.5)

## 2011-02-08 LAB — GLUCOSE, CAPILLARY
Glucose-Capillary: 134 mg/dL — ABNORMAL HIGH (ref 70–99)
Glucose-Capillary: 164 mg/dL — ABNORMAL HIGH (ref 70–99)
Glucose-Capillary: 158 mg/dL — ABNORMAL HIGH (ref 70–99)

## 2011-02-08 LAB — BASIC METABOLIC PANEL
BUN: 20 mg/dL (ref 6–23)
Chloride: 95 mEq/L — ABNORMAL LOW (ref 96–112)
GFR calc Af Amer: >60 mL/min (ref >60)
Glucose, Bld: 155 mg/dL — ABNORMAL HIGH (ref 70–99)
Potassium: 3.5 mEq/L (ref 3.5–5.1)
Sodium: 131 mEq/L — ABNORMAL LOW (ref 135–145)

## 2011-02-08 LAB — HEMOGLOBIN A1C: Mean Plasma Glucose: 140 mg/dL — ABNORMAL HIGH (ref <117)

## 2011-02-08 LAB — PHOSPHORUS: Phosphorus: 2.4 mg/dL (ref 2.3–4.6)

## 2011-02-08 MED ORDER — SODIUM CHLORIDE 0.9 % IJ SOLN
INTRAMUSCULAR | Status: AC
  Filled 2011-02-08: qty 10

## 2011-02-08 MED ORDER — MORPHINE SULFATE 2 MG/ML IJ SOLN
2.0000 mg | INTRAMUSCULAR | Status: DC | PRN
  Administered 2011-02-08 – 2011-02-11 (×8): 2 mg via INTRAVENOUS
  Filled 2011-02-08 (×8): qty 1

## 2011-02-08 MED ORDER — INSULIN ASPART 100 UNIT/ML ~~LOC~~ SOLN
0.0000 [IU] | SUBCUTANEOUS | Status: DC
  Administered 2011-02-09: 3 [IU] via SUBCUTANEOUS

## 2011-02-08 NOTE — Progress Notes (Signed)
UR Chart Review Completed  

## 2011-02-08 NOTE — Progress Notes (Signed)
I encouraged patient to get up and walk with me tonight.  She stated she tried to get up earlier and could not, she refused to get out of bed.  I explained to her about the importance of moving after surgery and how she would feel much better once she ambulated and was able to pass gas.  Patient voiced understanding and still refused.  I stated that I am here all shift and we would try to walk after while even if it is just a short distance.

## 2011-02-08 NOTE — Progress Notes (Signed)
1 Day Post-Op  Subjective: Still having moderate incisional pain. She is having difficulty voiding. Her appetite is decreased.  Objective: Vital signs in last 24 hours: Temp:  [97.9 F (36.6 C)-100.2 F (37.9 C)] 99.8 F (37.7 C) (07/24 1019) Pulse Rate:  [76-108] 90  (07/24 1019) Resp:  [15-26] 18  (07/24 1019) BP: (107-143)/(38-70) 121/69 mmHg (07/24 1019) SpO2:  [91 %-100 %] 91 % (07/24 1019) Weight:  [74.39 kg (164 lb)] 164 lb (74.39 kg) (07/23 1139) Last BM Date: 02/07/11  Intake/Output from previous day: 07/23 0701 - 07/24 0700 In: 2030 [I.V.:1930; IV Piggyback:100] Out: 635 [Urine:500; Drains:110; Blood:25] Intake/Output this shift: I/O this shift: In: 100 [P.O.:100] Out: -   General appearance: alert and cooperative Resp: clear to auscultation bilaterally Cardio: regular rate and rhythm, S1, S2 normal, no murmur, click, rub or gallop Abdomen: Soft. Dressings dry and intact. JP drainage serosanguineous.  Lab Results:   Basename 02/08/11 0506 02/07/11 1153  WBC 6.5 15.7*  HGB 11.0* 12.6  HCT 31.7* 36.0  PLT 248 249   BMET  Basename 02/08/11 0506 02/07/11 1153  NA 131* 133*  K 3.5 2.9*  CL 95* 95*  CO2 25 25  GLUCOSE 155* 190*  BUN 20 18  CREATININE 0.82 0.77  CALCIUM 8.3* 9.5    Anti-infectives: Anti-infectives     Start     Dose/Rate Route Frequency Ordered Stop   02/08/11 1500   ertapenem (INVANZ) 1 g in sodium chloride 0.9 % 50 mL IVPB        1 g 100 mL/hr over 30 Minutes Intravenous Every 24 hours 02/07/11 2229     02/07/11 2200   ertapenem (INVANZ) 1 g in sodium chloride 0.9 % 50 mL IVPB  Status:  Discontinued        1 g 100 mL/hr over 30 Minutes Intravenous Every 24 hours 02/07/11 2112 02/07/11 2229   02/07/11 1530   ertapenem (INVANZ) 1 g in sodium chloride 0.9 % 50 mL IVPB        1 g 100 mL/hr over 30 Minutes Intravenous  Once 02/07/11 1516 02/07/11 1556          Assessment/Plan: s/p Procedure(s): APPENDECTOMY  LAPAROSCOPIC Impression: Stable. Awaiting return of bowel function. Some urinary retention noted. This is probably secondary to pain from surgery.  Plan: We'll start ambulating the patient. She may require in and out catheterization until her urinary retention resolves. Would continue IV Invanz. Check labs in a.m.   LOS: 1 day    Rawleigh Rode A 02/08/2011

## 2011-02-09 LAB — CBC
HCT: 35.3 % — ABNORMAL LOW (ref 36.0–46.0)
MCV: 86.7 fL (ref 78.0–100.0)
RDW: 14.2 % (ref 11.5–15.5)
WBC: 13 10*3/uL — ABNORMAL HIGH (ref 4.0–10.5)

## 2011-02-09 LAB — BASIC METABOLIC PANEL
BUN: 16 mg/dL (ref 6–23)
Chloride: 98 mEq/L (ref 96–112)
Creatinine, Ser: 0.63 mg/dL (ref 0.50–1.10)
GFR calc Af Amer: >60 mL/min (ref >60)

## 2011-02-09 LAB — GLUCOSE, CAPILLARY: Glucose-Capillary: 127 mg/dL — ABNORMAL HIGH (ref 70–99)

## 2011-02-09 MED ORDER — MAGNESIUM HYDROXIDE 400 MG/5ML PO SUSP
30.0000 mL | Freq: Three times a day (TID) | ORAL | Status: DC
  Administered 2011-02-09 – 2011-02-10 (×6): 30 mL via ORAL
  Filled 2011-02-09 (×6): qty 30

## 2011-02-09 MED ORDER — INSULIN ASPART 100 UNIT/ML ~~LOC~~ SOLN
0.0000 [IU] | Freq: Three times a day (TID) | SUBCUTANEOUS | Status: DC
  Administered 2011-02-09: 3 [IU] via SUBCUTANEOUS
  Administered 2011-02-09: 2 [IU] via SUBCUTANEOUS

## 2011-02-09 MED ORDER — BISACODYL 10 MG RE SUPP: 10.0000 mg | Freq: Every day | RECTAL | Status: DC | PRN

## 2011-02-09 NOTE — Progress Notes (Signed)
Pt ambulated x2 with writer in room and out to hallway and back, tolerated well. O2 sat on ra 92%.  Still not passing any flatus, bs faint.   IS encouraged.  Cough and deep breathing encouraged as well.  Pt needs a lot of encouragement.

## 2011-02-09 NOTE — Progress Notes (Signed)
02/09/11 Patient ambulated in hall this shift with 2 person assistance.  She walked from her room half way to nurses station and back.  She needs much encouragement to ambulate, but stated she felt better afterwards.  Patient was assisted with a bath following her walk and sat on bedside commode for her bath.  I worked with her on her incentive spirometer and she could barely move spirometer to 250 ml.  I explained to her the importance of coughing and deep breathing and she stated that coughing hurt.  I gave her a pillow to splint her abdomen with and encouraged her to keep practicing with the incentive spirometer.  Patient voiced understanding of all education.

## 2011-02-09 NOTE — Progress Notes (Signed)
2 Days Post-Op  Subjective: No nausea or vomiting. Pain well controlled. No bowel movement or flatus yet.  Objective: Vital signs in last 24 hours: Temp:  [98.7 F (37.1 C)-100.2 F (37.9 C)] 98.9 F (37.2 C) (07/25 0500) Pulse Rate:  [92-107] 92  (07/25 0500) Resp:  [16-18] 18  (07/25 0500) BP: (113-121)/(67-70) 121/68 mmHg (07/25 0500) SpO2:  [91 %-96 %] 94 % (07/25 0747) Last BM Date: 02/07/11  Intake/Output from previous day: 07/24 0701 - 07/25 0700 In: 2334.6 [P.O.:230; I.V.:2104.6] Out: 130 [Drains:130] Intake/Output this shift: I/O this shift: In: 120 [P.O.:120] Out: 100 [Other:100]  General appearance: alert and cooperative Resp: clear to auscultation bilaterally Cardio: regular rate and rhythm, S1, S2 normal, no murmur, click, rub or gallop Abdomen: Soft but distended. Incisions healing well. JP drain with serous drainage.  Lab Results:   [facility] 02/09/11 0513 02/08/11 0506  WBC 13.0* 6.5  HGB 12.2 11.0*  HCT 35.3* 31.7*  PLT 352 248   BMET  Basename 02/09/11 0513 02/08/11 0506  NA 128* 131*  K 4.0 3.5  CL 98 95*  CO2 19 25  GLUCOSE 174* 155*  BUN 16 20  CREATININE 0.63 0.82  CALCIUM 9.0 8.3*    Anti-infectives: Anti-infectives     Start     Dose/Rate Route Frequency Ordered Stop   02/07/11 2200   ertapenem (INVANZ) 1 g in sodium chloride 0.9 % 50 mL IVPB  Status:  Discontinued        1 g 100 mL/hr over 30 Minutes Intravenous Every 24 hours 02/07/11 2112 02/07/11 2229   02/07/11 1530   ertapenem (INVANZ) 1 g in sodium chloride 0.9 % 50 mL IVPB        1 g 100 mL/hr over 30 Minutes Intravenous  Once 02/07/11 1516 02/07/11 1556          Assessment/Plan: s/p Procedure(s): APPENDECTOMY LAPAROSCOPIC for perforated acute appendicitis.  Leukocytosis secondary to appendicitis. Hyponatremia secondary to peritonitis.  Plan: Awaiting return of bowel function. Will adjust IV fluids. Continue Invanz as ordered. Continue to ambulate patient.  LOS:  2 days    Lyndia Bury A 02/09/2011

## 2011-02-10 LAB — BASIC METABOLIC PANEL
Calcium: 8.5 mg/dL (ref 8.4–10.5)
GFR calc Af Amer: >60 mL/min (ref >60)
GFR calc non Af Amer: >60 mL/min (ref >60)
Sodium: 131 mEq/L — ABNORMAL LOW (ref 135–145)

## 2011-02-10 LAB — MAGNESIUM: Magnesium: 1.9 mg/dL (ref 1.5–2.5)

## 2011-02-10 LAB — CBC
HCT: 34.3 % — ABNORMAL LOW (ref 36.0–46.0)
Hemoglobin: 11.9 g/dL — ABNORMAL LOW (ref 12.0–15.0)
MCV: 86.2 fL (ref 78.0–100.0)
Platelets: 377 10*3/uL (ref 150–400)
RBC: 3.98 MIL/uL (ref 3.87–5.11)
WBC: 9.9 10*3/uL (ref 4.0–10.5)

## 2011-02-10 LAB — GLUCOSE, CAPILLARY
Glucose-Capillary: 164 mg/dL — ABNORMAL HIGH (ref 70–99)
Glucose-Capillary: 134 mg/dL — ABNORMAL HIGH (ref 70–99)

## 2011-02-10 LAB — PHOSPHORUS: Phosphorus: 1.4 mg/dL — ABNORMAL LOW (ref 2.3–4.6)

## 2011-02-10 MED ORDER — BISACODYL 10 MG RE SUPP
10.0000 mg | Freq: Two times a day (BID) | RECTAL | Status: DC
  Administered 2011-02-10 – 2011-02-11 (×3): 10 mg via RECTAL
  Filled 2011-02-10 (×3): qty 1

## 2011-02-10 MED ORDER — SODIUM PHOSPHATE 3 MMOLE/ML IV SOLN
20.0000 mmol | Freq: Once | INTRAVENOUS | Status: AC
  Administered 2011-02-10: 20 mmol via INTRAVENOUS
  Filled 2011-02-10: qty 6.67

## 2011-02-10 MED ORDER — INSULIN ASPART 100 UNIT/ML ~~LOC~~ SOLN
0.0000 [IU] | Freq: Three times a day (TID) | SUBCUTANEOUS | Status: DC
  Administered 2011-02-10: 3 [IU] via SUBCUTANEOUS
  Administered 2011-02-10: 2 [IU] via SUBCUTANEOUS
  Administered 2011-02-10: 3 [IU] via SUBCUTANEOUS

## 2011-02-10 NOTE — Progress Notes (Signed)
MEDICATION RELATED CONSULT NOTE - Sodium Phosphate   Indication: Phosphorus replacement  Patient Measurements: Height: 5\' 2"  (157.5 cm) Weight: 164 lb (74.39 kg) IBW/kg (Calculated) : 50.1     Vital Signs: Temp: 97.9 F (36.6 C) (07/26 0700) BP: 122/75 mmHg (07/26 0700) Pulse Rate: 111  (07/26 0700) Intake/Output from previous day: 07/25 0701 - 07/26 0700 In: 2110 [P.O.:270; I.V.:1740; IV Piggyback:100] Out: 1665 [Urine:875; Drains:440]      Labs:  Phosphorus level 1.4 (2.3-4.6)   Past Medical History  Diagnosis Date  . Diabetes mellitus   . Dependent edema   . Hypercholesteremia   . Blood transfusion   . Headache   . Anxiety         Assessment: Mild hypophosphatemia  Goal of Therapy:  Normalize phosphorus level (2.3-4.6)  Plan:  Sodium Phosphate 20 mmoles in D5W IV over 6 hours. Follow up phosphorus level in am.  Gilman Buttner, [place] J 02/10/2011,8:25 AM

## 2011-02-10 NOTE — Progress Notes (Signed)
3 Days Post-Op  Subjective: Still with some fatigue. No bowel movement or flatus yet. No nausea or vomiting.  Objective: Vital signs in last 24 hours: Temp:  [97.9 F (36.6 C)-98.7 F (37.1 C)] 97.9 F (36.6 C) (07/26 0700) Pulse Rate:  [100-111] 111  (07/26 0700) Resp:  [17-20] 18  (07/26 0700) BP: (122-138)/(75-80) 122/75 mmHg (07/26 0700) SpO2:  [94 %-97 %] 94 % (07/26 0700) FiO2 (%):  [21 %] 21 % (07/25 2133) Last BM Date: 02/07/11  Intake/Output from previous day: 07/25 0701 - 07/26 0700 In: 2110 [P.O.:270; I.V.:1740; IV Piggyback:100] Out: 1665 [Urine:875; Drains:440] Intake/Output this shift:    General appearance: alert and cooperative Resp: clear to auscultation bilaterally Cardio: regular rate and rhythm, S1, S2 normal, no murmur, click, rub or gallop Abdomen: Soft, slightly distended. Incisions healing well. JP drainage serous. Minimal bowel sounds. Lab Results:   Basename 02/10/11 0526 02/09/11 0513  WBC 9.9 13.0*  HGB 11.9* 12.2  HCT 34.3* 35.3*  PLT 377 352   BMET  Basename 02/10/11 0526 02/09/11 0513  NA 131* 128*  K 4.1 4.0  CL 100 98  CO2 20 19  GLUCOSE 148* 174*  BUN 15 16  CREATININE 0.54 0.63  CALCIUM 8.5 9.0    Phosphorus 1.4. Anti-infectives: Anti-infectives     Start     Dose/Rate Route Frequency Ordered Stop   02/07/11 2200   ertapenem (INVANZ) 1 g in sodium chloride 0.9 % 50 mL IVPB  Status:  Discontinued        1 g 100 mL/hr over 30 Minutes Intravenous Every 24 hours 02/07/11 2112 02/07/11 2229   02/07/11 1530   ertapenem (INVANZ) 1 g in sodium chloride 0.9 % 50 mL IVPB        1 g 100 mL/hr over 30 Minutes Intravenous  Once 02/07/11 1516 02/07/11 1556          Assessment/Plan: s/p Procedure(s): APPENDECTOMY LAPAROSCOPIC  Awaiting return of bowel function. Hyponatremia resolving. Hyperphosphatemia noted. Will supplement phosphorus. We'll try to ambulate to get her bowel function returned. I told patient that this was  normal considering she had perforated appendicitis.  LOS: 3 days    Ethell Blatchford A 02/10/2011

## 2011-02-10 NOTE — Progress Notes (Signed)
7/26 spoke to pt, husband and daughter in pts room and also spoke to daughter Alona Bene law pts health care poa at [phone] concerning d/c needs. pts daughter lives next door. Pt will return home at d/c may benefit from home health rn,  And PT at d/c  Family request [facility]

## 2011-02-11 LAB — GLUCOSE, CAPILLARY: Glucose-Capillary: 161 mg/dL — ABNORMAL HIGH (ref 70–99)

## 2011-02-11 LAB — BASIC METABOLIC PANEL
BUN: 13 mg/dL (ref 6–23)
Creatinine, Ser: 0.51 mg/dL (ref 0.50–1.10)
GFR calc Af Amer: >60 mL/min (ref >60)
GFR calc non Af Amer: >60 mL/min (ref >60)

## 2011-02-11 LAB — CBC
HCT: 32.7 % — ABNORMAL LOW (ref 36.0–46.0)
MCHC: 34.9 g/dL (ref 30.0–36.0)
MCV: 86.1 fL (ref 78.0–100.0)
RDW: 14.1 % (ref 11.5–15.5)

## 2011-02-11 MED ORDER — DEXTROSE 5 % IV SOLN
30.0000 mmol | Freq: Once | INTRAVENOUS | Status: AC
  Administered 2011-02-11: 30 mmol via INTRAVENOUS
  Filled 2011-02-11: qty 10

## 2011-02-11 MED ORDER — MAGNESIUM HYDROXIDE 400 MG/5ML PO SUSP
30.0000 mL | Freq: Two times a day (BID) | ORAL | Status: DC
  Filled 2011-02-11: qty 30

## 2011-02-11 MED ORDER — INSULIN ASPART 100 UNIT/ML ~~LOC~~ SOLN
0.0000 [IU] | Freq: Two times a day (BID) | SUBCUTANEOUS | Status: DC
  Administered 2011-02-11: 2 [IU] via SUBCUTANEOUS

## 2011-02-11 MED ORDER — BISACODYL 10 MG RE SUPP: 10.0000 mg | Freq: Once | RECTAL | Status: DC

## 2011-02-11 MED ORDER — INSULIN ASPART 100 UNIT/ML ~~LOC~~ SOLN: 0.0000 [IU] | Freq: Three times a day (TID) | SUBCUTANEOUS | Status: DC

## 2011-02-11 NOTE — Progress Notes (Signed)
Physical Therapy Evaluation Patient Name: Kendra White Date: 02/11/2011 Problem List: There is no problem list on file for this patient.  Past Medical History:  Past Medical History  Diagnosis Date  . Diabetes mellitus   . Dependent edema   . Hypercholesteremia   . Blood transfusion   . Headache   . Anxiety    Past Surgical History:  Past Surgical History  Procedure Date  . Abdominal hysterectomy     Precautions/Restrictions  Precautions Precautions: Fall Required Braces or Orthoses: No Restrictions Weight Bearing Restrictions: No Prior Functioning  Home Living Type of Home: House Lives With: Spouse Receives Help From: Family Home Layout: Two level;Able to live on main level with bedroom/bathroom Home Access: Stairs to enter Entrance Stairs-Rails: Right Entrance Stairs-Number of Steps: 2 Bathroom Shower/Tub: Health visitor: Standard Bathroom Accessibility: Yes How Accessible: Accessible via walker Home Adaptive Equipment: Bedside commode/3-in-1;Straight cane;Walker - rolling;Shower chair with back Prior Function Level of Independence: Independent with basic ADLs;Independent with homemaking with ambulation;Independent with gait;Independent with transfers Driving: Yes Vocation: Retired Producer, television/film/video: Awake/alert Overall Cognitive Status: Appears within functional limits for tasks assessed Orientation Level: Oriented X4 Sensation/Coordination Sensation Light Touch: Appears Intact Proprioception: Appears Intact Extremity Assessment RUE Assessment RUE Assessment: Within Functional Limits LUE Assessment LUE Assessment: Within Functional Limits RLE Assessment RLE Assessment: Within Functional Limits LLE Assessment LLE Assessment: Within Functional Limits Mobility (including Balance) Bed Mobility Bed Mobility: Yes Rolling Left: 6: Modified independent (Device/Increase time);With rail Left Sidelying to Sit: 3:  Mod assist;With rails Left Sidelying to Sit Details (indicate cue type and reason): assist for control of trunk, v.c. for using correct technique Sitting - Scoot to Edge of Bed: 6: Modified independent (Device/Increase time) Transfers Transfers: Yes Sit to Stand: 4: Min assist;With armrests;From chair/3-in-1;With upper extremity assist Sit to Stand Details (indicate cue type and reason): cues for anterior weight shift Stand to Sit: 5: Supervision Stand to Sit Details: cues for proper hand placement Stand Pivot Transfers: 4: Min assist Stand Pivot Transfer Details (indicate cue type and reason): cues for proper hand placement Ambulation/Gait Ambulation/Gait: Yes Ambulation/Gait Assistance: 5: Supervision Ambulation/Gait Assistance Details (indicate cue type and reason): now needs walker for gait in order to offload upper body weight in order to decrease abdominal discomfort Ambulation Distance (Feet): 80 Feet Assistive device: Rolling walker Gait Pattern: Decreased step length - right;Decreased step length - left;Step-through pattern;Trunk flexed Gait velocity: very slow, labored gait Stairs: No Wheelchair Mobility Wheelchair Mobility: No  Posture/Postural Control Posture/Postural Control: No significant limitations Balance Balance Assessed: No Exercise     End of Session PT - End of Session Equipment Utilized During Treatment: Gait belt Activity Tolerance: Patient limited by pain;Patient limited by fatigue Patient left: in chair;with call bell in reach;with family/visitor present Nurse Communication: Mobility status for transfers;Mobility status for ambulation General Behavior During Session: [facility] for tasks performed Cognition: [facility] for tasks performed PT Assessment/Plan/Recommendation PT Assessment Clinical Impression Statement: very deconditioned and fearful of mobilizing-needs a lot of encouragement to increase activity PT Recommendation/Assessment: Patient will need skilled  PT in the acute care venue PT Problem List: Decreased activity tolerance;Decreased mobility;Decreased knowledge of use of DME;Decreased safety awareness (decreased knowledge of body mechanics to reduce pain) Barriers to Discharge: None Barriers to Discharge Comments: husband is elderly and pt is concerned that he will not be able to manage her at home PT Therapy Diagnosis : Difficulty walking;Generalized weakness;Acute pain PT Plan PT Frequency: Min 3X/week PT Treatment/Interventions: DME instruction;Gait training;Therapeutic exercise;Patient/family  education PT Recommendation Equipment Recommended: Defer to next venue PT Goals  Acute Rehab PT Goals PT Goal Formulation: With patient/family Time For Goal Achievement: 7 days Pt will go Supine/Side to Sit: with supervision Pt will Transfer Sit to Stand/Stand to Sit: with min assist Pt will Ambulate: 51 - 150 feet;with supervision;with rolling walker Myrlene Broker L 02/11/2011, 12:52 PM

## 2011-02-11 NOTE — Progress Notes (Signed)
UR Chart Review Completed  

## 2011-02-11 NOTE — Progress Notes (Signed)
Late entry: Patient has started passing gas.

## 2011-02-11 NOTE — Progress Notes (Signed)
MEDICATION RELATED CONSULT NOTE - Sodium Phosphate   Indication: Phosphorus replacement  Patient Measurements: Height: 5\' 2"  (157.5 cm) Weight: 164 lb (74.39 kg) IBW/kg (Calculated) : 50.1     Vital Signs: Temp: 98.4 F (36.9 C) (07/27 0602) Temp src: Oral (07/27 0602) BP: 134/78 mmHg (07/27 0602) Pulse Rate: 94  (07/27 0602) Intake/Output from previous day: 07/26 0701 - 07/27 0700 In: 1641.7 [P.O.:360; I.V.:975; IV Piggyback:306.7] Out: 1080 [Urine:500; Drains:580]      Labs: 7/26 Phosphorus level 1.4 (2.3-4.6), 7/27 phosphorous level 1.7 (2.3-4.6)   Past Medical History  Diagnosis Date  . Diabetes mellitus   . Dependent edema   . Hypercholesteremia   . Blood transfusion   . Headache   . Anxiety         Assessment: Pt continues with hypophosphatemia. Not much movement in past 24 hours even with adequate repletion yesterday.  Goal of Therapy:  Normalize phosphorus level (2.3-4.6)  Plan:  Sodium Phosphate 30 mmoles in D5W IV over 6 hours. Follow up phosphorus level in am.  Lavonia Dana 02/11/2011,10:43 AM

## 2011-02-11 NOTE — Progress Notes (Signed)
4 Days Post-Op  Subjective: Feels better today. Had bowel movement. Tolerating diet by mouth well. Still feels weak and is not ambulating.  Objective: Vital signs in last 24 hours: Temp:  [98.4 F (36.9 C)-99.8 F (37.7 C)] 98.4 F (36.9 C) (07/27 0602) Pulse Rate:  [94-108] 94  (07/27 0602) Resp:  [20] 20  (07/27 0602) BP: (134-139)/(78-81) 134/78 mmHg (07/27 0602) SpO2:  [93 %] 93 % (07/27 0602) Last BM Date: 02/10/11  Intake/Output from previous day: 07/26 0701 - 07/27 0700 In: 1641.7 [P.O.:360; I.V.:975; IV Piggyback:306.7] Out: 1080 [Urine:500; Drains:580] Intake/Output this shift:    General appearance: alert and cooperative Resp: clear to auscultation bilaterally Cardio: regular rate and rhythm, S1, S2 normal, no murmur, click, rub or gallop Abdomen: Soft, nontender, nondistended. Incisions healing well. JP drainage serous in nature.  Lab Results:   [facility] 02/11/11 0508 02/10/11 0526  WBC 7.8 9.9  HGB 11.4* 11.9*  HCT 32.7* 34.3*  PLT 410* 377   BMET  Basename 02/11/11 0508 02/10/11 0526  NA 130* 131*  K 4.4 4.1  CL 97 100  CO2 21 20  GLUCOSE 124* 148*  BUN 13 15  CREATININE 0.51 0.54  CALCIUM 8.2* 8.5   Anti-infectives: Anti-infectives     Start     Dose/Rate Route Frequency Ordered Stop   02/07/11 2200   ertapenem (INVANZ) 1 g in sodium chloride 0.9 % 50 mL IVPB  Status:  Discontinued        1 g 100 mL/hr over 30 Minutes Intravenous Every 24 hours 02/07/11 2112 02/07/11 2229   02/07/11 1530   ertapenem (INVANZ) 1 g in sodium chloride 0.9 % 50 mL IVPB        1 g 100 mL/hr over 30 Minutes Intravenous  Once 02/07/11 1516 02/07/11 1556          Assessment/Plan: s/p Procedure(s): APPENDECTOMY LAPAROSCOPIC Impression: Patient slowly improving. She appears to be depressed and thus does not want to ambulate. She states she will try today. Plan for discharge tomorrow  LOS: 4 days    Kendra White A 02/11/2011

## 2011-02-11 NOTE — Progress Notes (Signed)
PT evaluated by PT  Today. PT recommeded short term rehab. Pt and family agree. Referral to sw.

## 2011-02-12 ENCOUNTER — Inpatient Hospital Stay
Admission: AD | Admit: 2011-02-12 | Discharge: 2011-02-16 | Disposition: A | Discharge status: Home / Self Care | Payer: Medicare Other | Source: Ambulatory Visit | Attending: Internal Medicine | Admitting: Internal Medicine

## 2011-02-12 LAB — PHOSPHORUS: Phosphorus: 2.6 mg/dL (ref 2.3–4.6)

## 2011-02-12 LAB — GLUCOSE, CAPILLARY: Glucose-Capillary: 127 mg/dL — ABNORMAL HIGH (ref 70–99)

## 2011-02-12 MED ORDER — HYDROCODONE-ACETAMINOPHEN 5-325 MG PO TABS: 1.0000 | ORAL_TABLET | Freq: Four times a day (QID) | ORAL | Status: AC | PRN

## 2011-02-12 NOTE — Progress Notes (Signed)
Report called to Kendra ashe, [facility]. Verbalized understanding. Pt dc'd to facility via nursing staff.  [name], [name] Stagers 02/12/2011

## 2011-02-12 NOTE — Consult Note (Signed)
Pt D/Ced today to [facility].  CSW spoke with Pt and husband, and contacted Mercie Eon at [facility].  All parties were in agreement re: D/C plan.  Pt was transported to [facility] by tech.  No further CSW intervention indicated at this time.

## 2011-02-12 NOTE — Progress Notes (Signed)
MEDICATION RELATED CONSULT NOTE - Sodium Phosphate   Indication: Phosphorus replacement  Patient Measurements: Height: 5\' 2"  (157.5 cm) Weight: 164 lb (74.39 kg) IBW/kg (Calculated) : 50.1     Vital Signs: Temp: 97.7 F (36.5 C) (07/28 2130) Temp src: Oral (07/28 0613) BP: 114/68 mmHg (07/28 8657) Pulse Rate: 88  (07/28 0613) Intake/Output from previous day: 07/27 0701 - 07/28 0700 In: 5890.8 [P.O.:620; I.V.:4960.8; IV Piggyback:310] Out: 1266 [Urine:975; Drains:290; Stool:1]   I/O this shift: In: -  Out: 75 [Urine:75]  Labs: 7/26 Phosphorus level 1.4 (2.3-4.6), 7/27 phosphorous level 1.7 (2.3-4.6)   Past Medical History  Diagnosis Date  . Diabetes mellitus   . Dependent edema   . Hypercholesteremia   . Blood transfusion   . Headache   . Anxiety         Assessment: Pt now with Phosphorous 2.6 (wnl) past 2 days of replacement therapy.  Goal of Therapy:  Normalize phosphorus level (2.3-4.6)  Plan:  No more phosphorous needed at this time. Will check one more phosphorous in a.m. to make sure stays wnl. Will sign off - please reconsult pharmacy if needed.  Kendra White 02/12/2011,8:07 AM

## 2011-02-12 NOTE — Discharge Summary (Signed)
Physician Discharge Summary  Patient ID: Kendra White MRN: [id number] DOB/AGE: 75/20/1932 75 y.o.  Admit date: 02/07/2011 Discharge date: 02/12/2011  Admission Diagnoses: Acute appendicitis with perforation  Discharge Diagnoses: Acute appendicitis with perforation, difficulty with ambulation Active Problems:  * No active hospital problems. *    Discharged Condition: fair  Hospital Course: Patient is a 77 year old white female who presented to the emergency room on 02/07/2011 with right lower quadrant abdominal pain. It had been present for 3 days. CT scan of the abdomen pelvis revealed acute appendicitis with perforation. Surgery was consulted and the patient was taken to the operating room and underwent a laparoscopic appendectomy. A perforated appendix was found.  Her postoperative course has been remarkable for generalized weakness and inability to ambulate well without assistance. Her leukocytosis resolved. Her bowel function was slow to return. She is taking by mouth moderately well.  Physical therapy was consulted and was felt that the patient would benefit from short-term skilled nursing care.  Significant Diagnostic Studies: radiology: CT scan: Perforated appendicitis  Discharge Exam: Blood pressure 114/68, pulse 88, temperature 97.7 F (36.5 C), temperature source Oral, resp. rate 17, height 5\' 2"  (1.575 m), weight 74.39 kg (164 lb), SpO2 94.00%. General appearance: alert and appears stated age Resp: clear to auscultation bilaterally Cardio: regular rate and rhythm, S1, S2 normal, no murmur, click, rub or gallop Abdomen: Soft, nontender, nondistended. Incisions healing well, staples removed, Steri-Strips applied. JP drain removed.  Disposition: [facility]  Current Discharge Medication List    START taking these medications   Details  HYDROcodone-acetaminophen (NORCO) 5-325 MG per tablet Take 1 tablet by mouth every 6 (six) hours as needed for pain. Qty: 30  tablet, Refills: 0      CONTINUE these medications which have NOT CHANGED   Details  ALPRAZolam (XANAX) 0.25 MG tablet Take 0.25 mg by mouth 2 (two) times daily.      aspirin 81 MG EC tablet Take 81 mg by mouth daily.      Calcium Carbonate-Vitamin D (CALCIUM + D PO) Take by mouth. 600/200mg -unit daily    fenofibrate 160 MG tablet Take 160 mg by mouth daily.      fish oil-omega-3 fatty acids 1000 MG capsule Take 2 g by mouth 2 (two) times daily.      Garlic 1000 MG CAPS Take 1,000 mg by mouth once.     hydrochlorothiazide 50 MG tablet Take 50 mg by mouth daily.      metFORMIN (GLUCOPHAGE) 500 MG tablet Take 500 mg by mouth daily with breakfast.      Multiple Vitamins-Minerals (MULTIVITAMIN WITH MINERALS) tablet Take 1 tablet by mouth daily.      niacin (NIASPAN) 500 MG CR tablet Take 1,000 mg by mouth 2 (two) times daily.      rosuvastatin (CRESTOR) 20 MG tablet Take 20 mg by mouth daily.      acetaminophen (TYLENOL) 325 MG tablet Take 325 mg by mouth every 6 (six) hours as needed. For pain        Follow-up Information    Follow up with Sun Wilensky A. (As needed)    Contact information:   [address] [place] [address] [phone]          Signed: Dalia Heading 02/12/2011, 9:04 AM

## 2011-02-14 LAB — GLUCOSE, CAPILLARY
Glucose-Capillary: 130 mg/dL — ABNORMAL HIGH (ref 70–99)
Glucose-Capillary: 197 mg/dL — ABNORMAL HIGH (ref 70–99)

## 2011-02-15 LAB — GLUCOSE, CAPILLARY
Glucose-Capillary: 144 mg/dL — ABNORMAL HIGH (ref 70–99)
Glucose-Capillary: 174 mg/dL — ABNORMAL HIGH (ref 70–99)

## 2011-02-16 ENCOUNTER — Encounter (HOSPITAL_COMMUNITY): Payer: Self-pay | Admitting: General Surgery

## 2011-02-16 LAB — GLUCOSE, CAPILLARY
Glucose-Capillary: 124 mg/dL — ABNORMAL HIGH (ref 70–99)
Glucose-Capillary: 172 mg/dL — ABNORMAL HIGH (ref 70–99)
Glucose-Capillary: 178 mg/dL — ABNORMAL HIGH (ref 70–99)
Glucose-Capillary: 166 mg/dL — ABNORMAL HIGH (ref 70–99)
Glucose-Capillary: 152 mg/dL — ABNORMAL HIGH (ref 70–99)

## 2011-03-03 ENCOUNTER — Other Ambulatory Visit (HOSPITAL_COMMUNITY): Payer: Self-pay | Admitting: *Deleted

## 2011-03-03 DIAGNOSIS — Z139 Encounter for screening, unspecified: Secondary | ICD-10-CM

## 2011-03-07 ENCOUNTER — Ambulatory Visit (HOSPITAL_COMMUNITY)
Admission: RE | Admit: 2011-03-07 | Discharge: 2011-03-07 | Disposition: A | Discharge status: Home / Self Care | Payer: Medicare Other | Source: Ambulatory Visit | Attending: *Deleted | Admitting: *Deleted

## 2011-03-07 DIAGNOSIS — Z139 Encounter for screening, unspecified: Secondary | ICD-10-CM

## 2011-03-07 DIAGNOSIS — Z1231 Encounter for screening mammogram for malignant neoplasm of breast: Secondary | ICD-10-CM | POA: Insufficient documentation

## 2011-03-09 NOTE — Progress Notes (Signed)
Encounter addended by: Karen Kays on: 03/09/2011  7:00 PM<BR>     Documentation filed: Flowsheet VN

## 2011-09-01 ENCOUNTER — Encounter: Payer: Self-pay | Admitting: Internal Medicine

## 2011-10-20 ENCOUNTER — Encounter: Payer: Self-pay | Admitting: Internal Medicine

## 2011-11-22 ENCOUNTER — Ambulatory Visit (AMBULATORY_SURGERY_CENTER): Payer: Medicare Other

## 2011-11-22 VITALS — Ht 62.0 in | Wt 158.8 lb

## 2011-11-22 DIAGNOSIS — Z800 Family history of malignant neoplasm of digestive organs: Secondary | ICD-10-CM

## 2011-11-22 DIAGNOSIS — Z1211 Encounter for screening for malignant neoplasm of colon: Secondary | ICD-10-CM

## 2011-11-22 MED ORDER — PEG-KCL-NACL-NASULF-NA ASC-C 100 G PO SOLR: 1.0000 | Freq: Once | ORAL | Status: AC

## 2011-12-06 ENCOUNTER — Encounter: Payer: Self-pay | Admitting: Internal Medicine

## 2011-12-06 ENCOUNTER — Ambulatory Visit (AMBULATORY_SURGERY_CENTER): Payer: Medicare Other | Admitting: Internal Medicine

## 2011-12-06 VITALS — BP 134/71 | HR 70 | Temp 98.6°F | Resp 16 | Ht 62.0 in | Wt 158.0 lb

## 2011-12-06 DIAGNOSIS — Z1211 Encounter for screening for malignant neoplasm of colon: Secondary | ICD-10-CM

## 2011-12-06 DIAGNOSIS — Z800 Family history of malignant neoplasm of digestive organs: Secondary | ICD-10-CM

## 2011-12-06 MED ORDER — SODIUM CHLORIDE 0.9 % IV SOLN: 500.0000 mL | INTRAVENOUS | Status: DC

## 2011-12-06 NOTE — Patient Instructions (Signed)
Discharge instructions given with verbal understanding. Resume previous medications.YOU HAD AN ENDOSCOPIC PROCEDURE TODAY AT THE [facility]: Refer to the procedure report that was given to you for any specific questions about what was found during the examination.  If the procedure report does not answer your questions, please call your gastroenterologist to clarify.  If you requested that your care partner not be given the details of your procedure findings, then the procedure report has been included in a sealed envelope for you to review at your convenience later.  YOU SHOULD EXPECT: Some feelings of bloating in the abdomen. Passage of more gas than usual.  Walking can help get rid of the air that was put into your GI tract during the procedure and reduce the bloating. If you had a lower endoscopy (such as a colonoscopy or flexible sigmoidoscopy) you may notice spotting of blood in your stool or on the toilet paper. If you underwent a bowel prep for your procedure, then you may not have a normal bowel movement for a few days.  DIET: Your first meal following the procedure should be a light meal and then it is ok to progress to your normal diet.  A half-sandwich or bowl of soup is an example of a good first meal.  Heavy or fried foods are harder to digest and may make you feel nauseous or bloated.  Likewise meals heavy in dairy and vegetables can cause extra gas to form and this can also increase the bloating.  Drink plenty of fluids but you should avoid alcoholic beverages for 24 hours.  ACTIVITY: Your care partner should take you home directly after the procedure.  You should plan to take it easy, moving slowly for the rest of the day.  You can resume normal activity the day after the procedure however you should NOT DRIVE or use heavy machinery for 24 hours (because of the sedation medicines used during the test).    SYMPTOMS TO REPORT IMMEDIATELY: A gastroenterologist can be reached at  any hour.  During normal business hours, 8:30 AM to 5:00 PM Monday through Friday, call [phone].  After hours and on weekends, please call the GI answering service at [phone] who will take a message and have the physician on call contact you.   Following lower endoscopy (colonoscopy or flexible sigmoidoscopy):  Excessive amounts of blood in the stool  Significant tenderness or worsening of abdominal pains  Swelling of the abdomen that is new, acute  Fever of 100F or higher  FOLLOW UP: If any biopsies were taken you will be contacted by phone or by letter within the next 1-3 weeks.  Call your gastroenterologist if you have not heard about the biopsies in 3 weeks.  Our staff will call the home number listed on your records the next business day following your procedure to check on you and address any questions or concerns that you may have at that time regarding the information given to you following your procedure. This is a courtesy call and so if there is no answer at the home number and we have not heard from you through the emergency physician on call, we will assume that you have returned to your regular daily activities without incident.  SIGNATURES/CONFIDENTIALITY: You and/or your care partner have signed paperwork which will be entered into your electronic medical record.  These signatures attest to the fact that that the information above on your After Visit Summary has been reviewed and is understood.    Full responsibility of the confidentiality of this discharge information lies with you and/or your care-partner.  

## 2011-12-06 NOTE — Progress Notes (Signed)
Patient did not experience any of the following events: a burn prior to discharge; a fall within the facility; wrong site/side/patient/procedure/implant event; or a hospital transfer or hospital admission upon discharge from the facility. (G8907) Patient did not have preoperative order for IV antibiotic SSI prophylaxis. (G8918)  

## 2011-12-06 NOTE — Op Note (Signed)
[facility] [address] [place], [place]  [address]  COLONOSCOPY PROCEDURE REPORT  PATIENT:  Kendra White, Kendra White  MR#:  [id number] BIRTHDATE:  Apr 19, 1931, 81 yrs. old  GENDER:  female ENDOSCOPIST:  Hedwig Morton. Juanda Chance, MD REF. BY:  Samuel Jester, PROCEDURE DATE:  12/06/2011 PROCEDURE:  Colonoscopy 19147 ASA CLASS:  Class II INDICATIONS:  family history of colon cancer, history of pre-cancerous (adenomatous) colon polyps father with colon cancer  tubular adenoma 2008 prior colon 1992,1997,2004,2008 MEDICATIONS:   MAC sedation, administered by CRNA, propofol (Diprivan) 150 mg  DESCRIPTION OF PROCEDURE:   After the risks and benefits and of the procedure were explained, informed consent was obtained. Digital rectal exam was performed and revealed no rectal masses. The LB [id number] endoscope was introduced through the anus and advanced to the cecum, which was identified by both the appendix and ileocecal valve.  The quality of the prep was good, using MoviPrep.  The instrument was then slowly withdrawn as the colon was fully examined. <<PROCEDUREIMAGES>>  FINDINGS:  No polyps or cancers were seen (see image1, image2, and image3).   Retroflexed views in the rectum revealed no abnormalities.    The scope was then withdrawn from the patient and the procedure completed.  COMPLICATIONS:  None ENDOSCOPIC IMPRESSION: 1) No polyps or cancers 2) Normal colonoscopy RECOMMENDATIONS: 1) High fiber diet.  REPEAT EXAM:  In 0 year(s) for.  no recall due to advanced age  ______________________________ Hedwig Morton. Juanda Chance, MD  CC:  n. eSIGNED:   Hedwig Morton. Lesslie Mossa at 12/06/2011 12:55 PM  Roberto Scales, [phone]

## 2011-12-07 ENCOUNTER — Telehealth: Payer: Self-pay | Admitting: *Deleted

## 2011-12-07 NOTE — Telephone Encounter (Signed)
  Follow up Call-  Call back number 12/06/2011  Post procedure Call Back phone  # [phone]  Permission to leave phone message Yes     Patient questions:  Do you have a fever, pain , or abdominal swelling? no Pain Score  0 *  Have you tolerated food without any problems? yes  Have you been able to return to your normal activities? yes  Do you have any questions about your discharge instructions: Diet   no Medications  no Follow up visit  no  Do you have questions or concerns about your Care? no  Actions: * If pain score is 4 or above: No action needed, pain <4.

## 2012-03-26 ENCOUNTER — Other Ambulatory Visit (HOSPITAL_COMMUNITY): Payer: Self-pay | Admitting: *Deleted

## 2012-03-26 DIAGNOSIS — Z139 Encounter for screening, unspecified: Secondary | ICD-10-CM

## 2012-03-30 ENCOUNTER — Ambulatory Visit (HOSPITAL_COMMUNITY)
Admission: RE | Admit: 2012-03-30 | Discharge: 2012-03-30 | Disposition: A | Discharge status: Home / Self Care | Payer: Medicare Other | Source: Ambulatory Visit | Attending: *Deleted | Admitting: *Deleted

## 2012-03-30 DIAGNOSIS — Z139 Encounter for screening, unspecified: Secondary | ICD-10-CM

## 2012-03-30 DIAGNOSIS — Z1231 Encounter for screening mammogram for malignant neoplasm of breast: Secondary | ICD-10-CM | POA: Insufficient documentation

## 2013-03-25 ENCOUNTER — Other Ambulatory Visit (HOSPITAL_COMMUNITY): Payer: Self-pay | Admitting: *Deleted

## 2013-03-25 DIAGNOSIS — Z139 Encounter for screening, unspecified: Secondary | ICD-10-CM

## 2013-04-04 ENCOUNTER — Ambulatory Visit (HOSPITAL_COMMUNITY)
Admission: RE | Admit: 2013-04-04 | Discharge: 2013-04-04 | Disposition: A | Discharge status: Home / Self Care | Payer: Medicare Other | Source: Ambulatory Visit | Attending: *Deleted | Admitting: *Deleted

## 2013-04-04 DIAGNOSIS — Z1231 Encounter for screening mammogram for malignant neoplasm of breast: Secondary | ICD-10-CM | POA: Insufficient documentation

## 2013-04-04 DIAGNOSIS — Z139 Encounter for screening, unspecified: Secondary | ICD-10-CM

## 2014-04-03 ENCOUNTER — Other Ambulatory Visit (HOSPITAL_COMMUNITY): Payer: Self-pay | Admitting: *Deleted

## 2014-04-03 DIAGNOSIS — Z139 Encounter for screening, unspecified: Secondary | ICD-10-CM

## 2014-04-03 DIAGNOSIS — Z1231 Encounter for screening mammogram for malignant neoplasm of breast: Secondary | ICD-10-CM

## 2014-04-09 ENCOUNTER — Ambulatory Visit (HOSPITAL_COMMUNITY)
Admission: RE | Admit: 2014-04-09 | Discharge: 2014-04-09 | Disposition: A | Discharge status: Home / Self Care | Payer: Medicare Other | Source: Ambulatory Visit | Attending: *Deleted | Admitting: *Deleted

## 2014-04-09 DIAGNOSIS — Z1231 Encounter for screening mammogram for malignant neoplasm of breast: Secondary | ICD-10-CM | POA: Insufficient documentation

## 2014-04-18 ENCOUNTER — Encounter: Payer: Self-pay | Admitting: Internal Medicine

## 2015-05-25 ENCOUNTER — Other Ambulatory Visit (HOSPITAL_COMMUNITY): Payer: Self-pay | Admitting: *Deleted

## 2015-05-25 DIAGNOSIS — Z1231 Encounter for screening mammogram for malignant neoplasm of breast: Secondary | ICD-10-CM

## 2015-06-04 ENCOUNTER — Ambulatory Visit (HOSPITAL_COMMUNITY)
Admission: RE | Admit: 2015-06-04 | Discharge: 2015-06-04 | Disposition: A | Discharge status: Home / Self Care | Payer: Medicare Other | Source: Ambulatory Visit | Attending: *Deleted | Admitting: *Deleted

## 2015-06-04 DIAGNOSIS — Z1231 Encounter for screening mammogram for malignant neoplasm of breast: Secondary | ICD-10-CM

## 2016-08-12 DIAGNOSIS — I10 Essential (primary) hypertension: Secondary | ICD-10-CM | POA: Diagnosis present

## 2016-08-12 DIAGNOSIS — M5432 Sciatica, left side: Secondary | ICD-10-CM

## 2016-08-12 HISTORY — DX: Essential (primary) hypertension: I10

## 2016-08-12 HISTORY — DX: Sciatica, left side: M54.32

## 2016-09-16 ENCOUNTER — Inpatient Hospital Stay (HOSPITAL_COMMUNITY)
Admission: EM | Admit: 2016-09-16 | Discharge: 2016-09-21 | DRG: 683 | Disposition: A | Discharge status: Home Health | Payer: Medicare Other | Attending: Family Medicine | Admitting: Family Medicine

## 2016-09-16 ENCOUNTER — Emergency Department (HOSPITAL_COMMUNITY): Payer: Medicare Other

## 2016-09-16 ENCOUNTER — Encounter (HOSPITAL_COMMUNITY): Payer: Self-pay

## 2016-09-16 DIAGNOSIS — R05 Cough: Secondary | ICD-10-CM | POA: Diagnosis present

## 2016-09-16 DIAGNOSIS — J069 Acute upper respiratory infection, unspecified: Secondary | ICD-10-CM

## 2016-09-16 DIAGNOSIS — E876 Hypokalemia: Secondary | ICD-10-CM | POA: Diagnosis present

## 2016-09-16 DIAGNOSIS — J019 Acute sinusitis, unspecified: Secondary | ICD-10-CM | POA: Diagnosis present

## 2016-09-16 DIAGNOSIS — Z66 Do not resuscitate: Secondary | ICD-10-CM | POA: Diagnosis present

## 2016-09-16 DIAGNOSIS — H578 Other specified disorders of eye and adnexa: Secondary | ICD-10-CM | POA: Diagnosis present

## 2016-09-16 DIAGNOSIS — I10 Essential (primary) hypertension: Secondary | ICD-10-CM | POA: Diagnosis present

## 2016-09-16 DIAGNOSIS — N289 Disorder of kidney and ureter, unspecified: Secondary | ICD-10-CM | POA: Diagnosis not present

## 2016-09-16 DIAGNOSIS — F329 Major depressive disorder, single episode, unspecified: Secondary | ICD-10-CM | POA: Diagnosis present

## 2016-09-16 DIAGNOSIS — B9789 Other viral agents as the cause of diseases classified elsewhere: Secondary | ICD-10-CM

## 2016-09-16 DIAGNOSIS — Z8673 Personal history of transient ischemic attack (TIA), and cerebral infarction without residual deficits: Secondary | ICD-10-CM

## 2016-09-16 DIAGNOSIS — Z7982 Long term (current) use of aspirin: Secondary | ICD-10-CM

## 2016-09-16 DIAGNOSIS — Z7984 Long term (current) use of oral hypoglycemic drugs: Secondary | ICD-10-CM

## 2016-09-16 DIAGNOSIS — Z79899 Other long term (current) drug therapy: Secondary | ICD-10-CM | POA: Diagnosis not present

## 2016-09-16 DIAGNOSIS — E119 Type 2 diabetes mellitus without complications: Secondary | ICD-10-CM | POA: Diagnosis present

## 2016-09-16 DIAGNOSIS — E860 Dehydration: Secondary | ICD-10-CM | POA: Diagnosis present

## 2016-09-16 DIAGNOSIS — R059 Cough, unspecified: Secondary | ICD-10-CM | POA: Diagnosis present

## 2016-09-16 DIAGNOSIS — F419 Anxiety disorder, unspecified: Secondary | ICD-10-CM | POA: Insufficient documentation

## 2016-09-16 DIAGNOSIS — N179 Acute kidney failure, unspecified: Secondary | ICD-10-CM | POA: Diagnosis present

## 2016-09-16 DIAGNOSIS — E871 Hypo-osmolality and hyponatremia: Secondary | ICD-10-CM | POA: Diagnosis present

## 2016-09-16 DIAGNOSIS — K1379 Other lesions of oral mucosa: Secondary | ICD-10-CM | POA: Diagnosis not present

## 2016-09-16 DIAGNOSIS — E7800 Pure hypercholesterolemia, unspecified: Secondary | ICD-10-CM | POA: Diagnosis present

## 2016-09-16 DIAGNOSIS — M5430 Sciatica, unspecified side: Secondary | ICD-10-CM | POA: Diagnosis present

## 2016-09-16 DIAGNOSIS — J0140 Acute pansinusitis, unspecified: Secondary | ICD-10-CM | POA: Diagnosis not present

## 2016-09-16 DIAGNOSIS — J3489 Other specified disorders of nose and nasal sinuses: Secondary | ICD-10-CM

## 2016-09-16 DIAGNOSIS — H5789 Other specified disorders of eye and adnexa: Secondary | ICD-10-CM

## 2016-09-16 HISTORY — DX: Disorder of kidney and ureter, unspecified: N28.9

## 2016-09-16 HISTORY — DX: Essential (primary) hypertension: I10

## 2016-09-16 HISTORY — DX: Sciatica, left side: M54.32

## 2016-09-16 LAB — CBC WITH DIFFERENTIAL/PLATELET
Basophils Absolute: 0.1 10*3/uL (ref 0.0–0.1)
Basophils Relative: 1 %
EOS PCT: 2 %
Eosinophils Absolute: 0.3 10*3/uL (ref 0.0–0.7)
HEMATOCRIT: 36.9 % (ref 36.0–46.0)
HEMOGLOBIN: 12.9 g/dL (ref 12.0–15.0)
LYMPHS ABS: 2.6 10*3/uL (ref 0.7–4.0)
LYMPHS PCT: 21 %
MCH: 31.4 pg (ref 26.0–34.0)
MCHC: 35 g/dL (ref 30.0–36.0)
MCV: 89.8 fL (ref 78.0–100.0)
Monocytes Absolute: 1.7 10*3/uL — ABNORMAL HIGH (ref 0.1–1.0)
Monocytes Relative: 14 %
NEUTROS ABS: 7.5 10*3/uL (ref 1.7–7.7)
Neutrophils Relative %: 62 %
PLATELETS: 315 10*3/uL (ref 150–400)
RBC: 4.11 MIL/uL (ref 3.87–5.11)
RDW: 13.5 % (ref 11.5–15.5)
WBC: 12.1 10*3/uL — AB (ref 4.0–10.5)

## 2016-09-16 LAB — COMPREHENSIVE METABOLIC PANEL
ALK PHOS: 54 U/L (ref 38–126)
ALT: 16 U/L (ref 14–54)
AST: 25 U/L (ref 15–41)
Albumin: 3.5 g/dL (ref 3.5–5.0)
Anion gap: 13 (ref 5–15)
BILIRUBIN TOTAL: 0.7 mg/dL (ref 0.3–1.2)
BUN: 56 mg/dL — AB (ref 6–20)
CALCIUM: 9.9 mg/dL (ref 8.9–10.3)
CO2: 25 mmol/L (ref 22–32)
CREATININE: 1.62 mg/dL — AB (ref 0.44–1.00)
Chloride: 89 mmol/L — ABNORMAL LOW (ref 101–111)
GFR calc Af Amer: 33 mL/min — ABNORMAL LOW (ref >60)
GFR, EST NON AFRICAN AMERICAN: 28 mL/min — AB (ref >60)
GLUCOSE: 199 mg/dL — AB (ref 65–99)
Potassium: 2.9 mmol/L — ABNORMAL LOW (ref 3.5–5.1)
Sodium: 127 mmol/L — ABNORMAL LOW (ref 135–145)
TOTAL PROTEIN: 8 g/dL (ref 6.5–8.1)

## 2016-09-16 LAB — URINALYSIS, ROUTINE W REFLEX MICROSCOPIC
BILIRUBIN URINE: NEGATIVE
GLUCOSE, UA: NEGATIVE mg/dL
HGB URINE DIPSTICK: NEGATIVE
Ketones, ur: NEGATIVE mg/dL
Leukocytes, UA: NEGATIVE
Nitrite: NEGATIVE
PROTEIN: NEGATIVE mg/dL
Specific Gravity, Urine: 1.014 (ref 1.005–1.030)
pH: 5 (ref 5.0–8.0)

## 2016-09-16 LAB — INFLUENZA PANEL BY PCR (TYPE A & B)
Influenza A By PCR: NEGATIVE
Influenza B By PCR: NEGATIVE

## 2016-09-16 LAB — LIPASE, BLOOD: Lipase: 31 U/L (ref 11–51)

## 2016-09-16 LAB — GLUCOSE, CAPILLARY: Glucose-Capillary: 203 mg/dL — ABNORMAL HIGH (ref 65–99)

## 2016-09-16 MED ORDER — PHENOL 1.4 % MT LIQD
1.0000 | OROMUCOSAL | Status: DC | PRN
  Administered 2016-09-16: 1 via OROMUCOSAL
  Filled 2016-09-16: qty 177

## 2016-09-16 MED ORDER — ALPRAZOLAM 0.25 MG PO TABS
0.2500 mg | ORAL_TABLET | Freq: Two times a day (BID) | ORAL | Status: DC
  Administered 2016-09-16 – 2016-09-21 (×10): 0.25 mg via ORAL
  Filled 2016-09-16 (×10): qty 1

## 2016-09-16 MED ORDER — INSULIN ASPART 100 UNIT/ML ~~LOC~~ SOLN
4.0000 [IU] | Freq: Three times a day (TID) | SUBCUTANEOUS | Status: DC
  Administered 2016-09-17 – 2016-09-21 (×13): 4 [IU] via SUBCUTANEOUS

## 2016-09-16 MED ORDER — SODIUM CHLORIDE 0.9 % IV BOLUS (SEPSIS)
500.0000 mL | Freq: Once | INTRAVENOUS | Status: AC
  Administered 2016-09-16: 500 mL via INTRAVENOUS

## 2016-09-16 MED ORDER — ALBUTEROL SULFATE (2.5 MG/3ML) 0.083% IN NEBU
5.0000 mg | INHALATION_SOLUTION | Freq: Once | RESPIRATORY_TRACT | Status: AC
  Administered 2016-09-16: 5 mg via RESPIRATORY_TRACT
  Filled 2016-09-16: qty 6

## 2016-09-16 MED ORDER — CYCLOSPORINE 0.05 % OP EMUL
OPHTHALMIC | Status: AC
  Filled 2016-09-16: qty 1

## 2016-09-16 MED ORDER — ROSUVASTATIN CALCIUM 20 MG PO TABS
20.0000 mg | ORAL_TABLET | Freq: Every day | ORAL | Status: DC
  Administered 2016-09-17 (×2): 20 mg via ORAL
  Filled 2016-09-16 (×2): qty 1

## 2016-09-16 MED ORDER — PROPRANOLOL HCL 20 MG PO TABS
40.0000 mg | ORAL_TABLET | Freq: Every day | ORAL | Status: DC
  Administered 2016-09-16 – 2016-09-21 (×6): 40 mg via ORAL
  Filled 2016-09-16 (×6): qty 2

## 2016-09-16 MED ORDER — INSULIN ASPART 100 UNIT/ML ~~LOC~~ SOLN
0.0000 [IU] | Freq: Every day | SUBCUTANEOUS | Status: DC
  Administered 2016-09-16: 2 [IU] via SUBCUTANEOUS

## 2016-09-16 MED ORDER — NIACIN ER (ANTIHYPERLIPIDEMIC) 500 MG PO TBCR
1000.0000 mg | EXTENDED_RELEASE_TABLET | Freq: Two times a day (BID) | ORAL | Status: DC
  Administered 2016-09-17 – 2016-09-21 (×9): 1000 mg via ORAL
  Filled 2016-09-16 (×15): qty 2

## 2016-09-16 MED ORDER — ONDANSETRON HCL 4 MG PO TABS: 4.0000 mg | ORAL_TABLET | Freq: Four times a day (QID) | ORAL | Status: DC | PRN

## 2016-09-16 MED ORDER — HYDROCHLOROTHIAZIDE 25 MG PO TABS
50.0000 mg | ORAL_TABLET | Freq: Every day | ORAL | Status: DC
  Administered 2016-09-17: 50 mg via ORAL
  Filled 2016-09-16: qty 2

## 2016-09-16 MED ORDER — POTASSIUM CHLORIDE 10 MEQ/100ML IV SOLN
10.0000 meq | INTRAVENOUS | Status: AC
  Administered 2016-09-16 – 2016-09-17 (×3): 10 meq via INTRAVENOUS
  Filled 2016-09-16 (×3): qty 100

## 2016-09-16 MED ORDER — PRIMIDONE 50 MG PO TABS
ORAL_TABLET | ORAL | Status: AC
  Filled 2016-09-16: qty 1

## 2016-09-16 MED ORDER — ONDANSETRON HCL 4 MG/2ML IJ SOLN: 4.0000 mg | Freq: Four times a day (QID) | INTRAMUSCULAR | Status: DC | PRN

## 2016-09-16 MED ORDER — INSULIN ASPART 100 UNIT/ML ~~LOC~~ SOLN
0.0000 [IU] | Freq: Three times a day (TID) | SUBCUTANEOUS | Status: DC
  Administered 2016-09-17: 2 [IU] via SUBCUTANEOUS
  Administered 2016-09-17: 3 [IU] via SUBCUTANEOUS
  Administered 2016-09-18: 2 [IU] via SUBCUTANEOUS
  Administered 2016-09-18 – 2016-09-19 (×2): 3 [IU] via SUBCUTANEOUS
  Administered 2016-09-19: 2 [IU] via SUBCUTANEOUS
  Administered 2016-09-20: 5 [IU] via SUBCUTANEOUS
  Administered 2016-09-20: 3 [IU] via SUBCUTANEOUS
  Administered 2016-09-21: 2 [IU] via SUBCUTANEOUS

## 2016-09-16 MED ORDER — ENOXAPARIN SODIUM 30 MG/0.3ML ~~LOC~~ SOLN
30.0000 mg | SUBCUTANEOUS | Status: DC
  Administered 2016-09-16 – 2016-09-17 (×2): 30 mg via SUBCUTANEOUS
  Filled 2016-09-16 (×2): qty 0.3

## 2016-09-16 MED ORDER — PRIMIDONE 50 MG PO TABS
50.0000 mg | ORAL_TABLET | Freq: Two times a day (BID) | ORAL | Status: DC
  Administered 2016-09-17 – 2016-09-21 (×10): 50 mg via ORAL
  Filled 2016-09-16 (×14): qty 1

## 2016-09-16 MED ORDER — FAMOTIDINE 20 MG PO TABS
20.0000 mg | ORAL_TABLET | Freq: Two times a day (BID) | ORAL | Status: DC
  Administered 2016-09-16 – 2016-09-17 (×2): 20 mg via ORAL
  Filled 2016-09-16 (×2): qty 1

## 2016-09-16 MED ORDER — CYCLOBENZAPRINE HCL 10 MG PO TABS: 10.0000 mg | ORAL_TABLET | Freq: Once | ORAL | Status: DC

## 2016-09-16 MED ORDER — CITALOPRAM HYDROBROMIDE 20 MG PO TABS
40.0000 mg | ORAL_TABLET | Freq: Every day | ORAL | Status: DC
Start: 2016-09-17 — End: 2016-09-17
  Administered 2016-09-17: 40 mg via ORAL
  Filled 2016-09-16: qty 2

## 2016-09-16 MED ORDER — ASPIRIN EC 81 MG PO TBEC
81.0000 mg | DELAYED_RELEASE_TABLET | Freq: Every day | ORAL | Status: DC
  Administered 2016-09-17 – 2016-09-21 (×5): 81 mg via ORAL
  Filled 2016-09-16 (×5): qty 1

## 2016-09-16 MED ORDER — FENOFIBRATE 160 MG PO TABS
160.0000 mg | ORAL_TABLET | Freq: Every day | ORAL | Status: DC
  Administered 2016-09-17: 160 mg via ORAL
  Filled 2016-09-16: qty 1

## 2016-09-16 MED ORDER — ACETAMINOPHEN 650 MG RE SUPP
650.0000 mg | Freq: Four times a day (QID) | RECTAL | Status: DC | PRN
Start: 2016-09-16 — End: 2016-09-18

## 2016-09-16 MED ORDER — CYCLOSPORINE 0.05 % OP EMUL
1.0000 [drp] | Freq: Two times a day (BID) | OPHTHALMIC | Status: DC
  Administered 2016-09-17 (×3): 1 [drp] via OPHTHALMIC
  Filled 2016-09-16 (×6): qty 1

## 2016-09-16 MED ORDER — POTASSIUM CHLORIDE IN NACL 40-0.9 MEQ/L-% IV SOLN
INTRAVENOUS | Status: DC
  Administered 2016-09-16 – 2016-09-18 (×3): 75 mL/h via INTRAVENOUS

## 2016-09-16 MED ORDER — ACETAMINOPHEN 325 MG PO TABS
650.0000 mg | ORAL_TABLET | Freq: Four times a day (QID) | ORAL | Status: DC | PRN
  Administered 2016-09-17 (×2): 650 mg via ORAL
  Filled 2016-09-16 (×2): qty 2

## 2016-09-16 NOTE — H&P (Addendum)
History and Physical  Kendra White UVO:[id number] DOB: 11/21/1930 DOA: 09/16/2016  Referring physician: Dr Rosalia Hammersay, ED physician PCP: Samuel JesterYNTHIA BUTLER, DO  Outpatient Specialists: none  Patient Coming From: home  Chief Complaint: cough  HPI: Kendra White is a 83 y.o. female with a history of hypertension, anxiety, diabetes, dependent edema on Lasix, hyperlipidemia, history of TIA. Patient presents with complaints of cough, congestion, weakness for the past 2 weeks. Her symptoms started after developing right upper quadrant pain that radiated into her right shoulder 2 weeks ago that was accompanied with nausea and dry heaves (should be noted that while the patient points to right upper quadrant, the patient states "chest pain"). She saw her doctor, who thought that her pain was due to her gallbladder. The patient states that she was going to be referred to general surgery. Shortly after, her pain improved, but then became having more nasal congestion and coughing. She's had little oral intake both liquid and solid. She has been dry heaving.  Emergency Department Course: The patient received 500 mL bolus and a nebulizer treatment. Texture x-ray negative for pneumonia and flu swab was negative  Review of Systems:   Pt denies any diarrhea, constipation, abdominal pain, shortness of breath, dyspnea on exertion, orthopnea, wheezing, palpitations, headache, vision changes, lightheadedness, dizziness, melena, rectal bleeding.  Review of systems are otherwise negative  Past Medical History:  Diagnosis Date  . Acute renal insufficiency 09/16/2016  . Anxiety   . Blood transfusion   . Dependent edema   . Diabetes mellitus   . Essential hypertension 08/12/2016  . Headache(784.0)   . Hx-TIA (transient ischemic attack)   . Hypercholesteremia   . Sciatica, left side 08/12/2016  . Swelling of ankle    and abdomen  . Ulcer [facility](HCC)    Past Surgical History:  Procedure Laterality Date  . ABDOMINAL  HYSTERECTOMY    . COLONOSCOPY    . DILATION AND CURETTAGE OF UTERUS    . LAPAROSCOPIC APPENDECTOMY  02/07/2011   Procedure: APPENDECTOMY LAPAROSCOPIC;  Surgeon: Dalia HeadingMark A Jenkins;  Location: [facility] ORS;  Service: General;  Laterality: N/A;  . POLYPECTOMY     Social History:  reports that she has never smoked. She has never used smokeless tobacco. She reports that she does not drink alcohol or use drugs. Patient lives at Home  Allergies  Allergen Reactions  . Diphenhydramine Hcl Rash    Family History  Problem Relation Age of Onset  . Colon cancer Father   . Colon polyps Maternal Aunt   . Pancreatic cancer Brother      Prior to Admission medications   Medication Sig Start Date End Date Taking? Authorizing Provider  hydrochlorothiazide (HYDRODIURIL) 50 MG tablet Take 50 mg by mouth daily. 08/05/16  Yes Historical Provider, MD  acetaminophen (TYLENOL) 325 MG tablet Take 325 mg by mouth every 6 (six) hours as needed. For pain     Historical Provider, MD  ALPRAZolam (XANAX) 0.25 MG tablet Take 0.25 mg by mouth 2 (two) times daily.      Historical Provider, MD  aspirin 81 MG EC tablet Take 81 mg by mouth daily.      Historical Provider, MD  Biotin 5000 MCG CAPS Take by mouth daily.    Historical Provider, MD  Calcium Carbonate-Vitamin D (CALCIUM + D PO) Take by mouth. 600/200mg -unit daily    Historical Provider, MD  citalopram (CELEXA) 20 MG tablet Take 40 mg by mouth daily. 09/05/16   Historical Provider, MD  fenofibrate 160  MG tablet Take 160 mg by mouth daily.      Historical Provider, MD  fish oil-omega-3 fatty acids 1000 MG capsule Take 2 g by mouth 2 (two) times daily.     Historical Provider, MD  furosemide (LASIX) 40 MG tablet Take 40 mg by mouth daily. 09/05/16   Historical Provider, MD  Garlic 1000 MG CAPS Take 1,000 mg by mouth once.     Historical Provider, MD  metFORMIN (GLUCOPHAGE) 500 MG tablet Take 500 mg by mouth daily with breakfast.      Historical Provider, MD  Multiple  Vitamins-Minerals (MULTIVITAMIN WITH MINERALS) tablet Take 1 tablet by mouth daily.      Historical Provider, MD  niacin (NIASPAN) 500 MG CR tablet Take 1,000 mg by mouth 2 (two) times daily.      Historical Provider, MD  primidone (MYSOLINE) 50 MG tablet Take 50 mg by mouth 2 (two) times daily. 09/01/16   Historical Provider, MD  propranolol (INDERAL) 40 MG tablet Take 40 mg by mouth daily. 09/05/16   Historical Provider, MD  RESTASIS MULTIDOSE 0.05 % ophthalmic emulsion  08/12/16   Historical Provider, MD  rosuvastatin (CRESTOR) 20 MG tablet Take 20 mg by mouth daily.      Historical Provider, MD    Physical Exam: BP 130/61   Pulse 66   Temp 100.1 F (37.8 C) (Rectal)   Resp 10   Ht 5\' 3"  (1.6 m)   Wt 71.7 kg (158 lb)   SpO2 95%   BMI 27.99 kg/m   General: Elderly Caucasian female. Awake and alert and oriented x3. No acute cardiopulmonary distress.  HEENT: Normocephalic atraumatic.  Right and left ears normal in appearance.  Pupils equal, round, reactive to light. Extraocular muscles are intact. Sclerae anicteric and noninjected.  Moist mucosal membranes. No mucosal lesions.  Neck: Neck supple without lymphadenopathy. No carotid bruits. No masses palpated.  Cardiovascular: Regular rate with normal S1-S2 sounds. No murmurs, rubs, gallops auscultated. No JVD.  Respiratory: Good respiratory effort with no wheezes, rales, rhonchi. Lungs clear to auscultation bilaterally.  No accessory muscle use. Abdomen: Soft, mild diffuse tenderness without rebound or guarding, nondistended. Active bowel sounds. No masses or hepatosplenomegaly  Skin: No rashes, lesions, or ulcerations.  Dry, warm to touch. 2+ dorsalis pedis and radial pulses. Musculoskeletal: No calf or leg pain. All major joints not erythematous nontender.  No upper or lower joint deformation.  Good ROM.  No contractures  Psychiatric: Intact judgment and insight. Pleasant and cooperative. Neurologic: No focal neurological deficits.  Strength is 5/5 and symmetric in upper and lower extremities.  Cranial nerves II through XII are grossly intact.           Labs on Admission: I have personally reviewed following labs and imaging studies  CBC:  Recent Labs Lab 09/16/16 1534  WBC 12.1*  NEUTROABS 7.5  HGB 12.9  HCT 36.9  MCV 89.8  PLT 315   Basic Metabolic Panel:  Recent Labs Lab 09/16/16 1534  NA 127*  K 2.9*  CL 89*  CO2 25  GLUCOSE 199*  BUN 56*  CREATININE 1.62*  CALCIUM 9.9   GFR: Estimated Creatinine Clearance: 25 mL/min (by C-G formula based on SCr of 1.62 mg/dL (H)). Liver Function Tests:  Recent Labs Lab 09/16/16 1534  AST 25  ALT 16  ALKPHOS 54  BILITOT 0.7  PROT 8.0  ALBUMIN 3.5    Recent Labs Lab 09/16/16 1534  LIPASE 31   No results for input(s): AMMONIA  in the last 168 hours. Coagulation Profile: No results for input(s): INR, PROTIME in the last 168 hours. Cardiac Enzymes: No results for input(s): CKTOTAL, CKMB, CKMBINDEX, TROPONINI in the last 168 hours. BNP (last 3 results) No results for input(s): PROBNP in the last 8760 hours. HbA1C: No results for input(s): HGBA1C in the last 72 hours. CBG: No results for input(s): GLUCAP in the last 168 hours. Lipid Profile: No results for input(s): CHOL, HDL, LDLCALC, TRIG, CHOLHDL, LDLDIRECT in the last 72 hours. Thyroid Function Tests: No results for input(s): TSH, T4TOTAL, FREET4, T3FREE, THYROIDAB in the last 72 hours. Anemia Panel: No results for input(s): VITAMINB12, FOLATE, FERRITIN, TIBC, IRON, RETICCTPCT in the last 72 hours. Urine analysis:    Component Value Date/Time   COLORURINE YELLOW 09/16/2016 1623   APPEARANCEUR HAZY (A) 09/16/2016 1623   LABSPEC 1.014 09/16/2016 1623   PHURINE 5.0 09/16/2016 1623   GLUCOSEU NEGATIVE 09/16/2016 1623   HGBUR NEGATIVE 09/16/2016 1623   BILIRUBINUR NEGATIVE 09/16/2016 1623   KETONESUR NEGATIVE 09/16/2016 1623   PROTEINUR NEGATIVE 09/16/2016 1623   UROBILINOGEN 1.0  02/07/2011 1207   NITRITE NEGATIVE 09/16/2016 1623   LEUKOCYTESUR NEGATIVE 09/16/2016 1623   Sepsis Labs: @LABRCNTIP (procalcitonin:4,lacticidven:4) )No results found for this or any previous visit (from the past 240 hour(s)).   Radiological Exams on Admission: Dg Chest 2 View  Result Date: 09/16/2016 CLINICAL DATA:  Cough and fever EXAM: CHEST  2 VIEW COMPARISON:  02/07/2011 FINDINGS: Heart size within normal limits. Negative for heart failure. Atherosclerotic calcification in the aortic arch. Lungs are clear without infiltrate effusion or mass. No acute skeletal abnormality. IMPRESSION: No active cardiopulmonary disease. Electronically Signed   By: Marlan Palau M.D.   On: 09/16/2016 15:17    EKG: Independently reviewed. Sinus rhythm with prolonged PR interval. Low voltage. No acute ST changes.  Assessment/Plan: Principal Problem:   Acute renal insufficiency Active Problems:   Dehydration   Hyponatremia   Hypokalemia   Diabetes mellitus (HCC)   Essential hypertension   Cough    This patient was discussed with the ED physician, including pertinent vitals, physical exam findings, labs, and imaging.  We also discussed care given by the ED provider.  #1 acute renal insufficiency  Admit as it'll be unlikely that the patient completely recovers within 24 hours.  IV fluids continuously  Check basic metabolic panel tomorrow morning #2 dehydration  Likely secondary to viral syndrome.  Antiemetics  IV fluids for rehydration  Hold Lasix #3 hyponatremia  Secondary due to dehydration  Recheck sodium #4 hypokalemia  Blood potassium to the fluids and give for runs of potassium   Telemetry monitoring  Recheck potassium tomorrow #5 cough  At this point, likely viral syndrome. #6 diabetes mellitus  Hold metformin due to acute renal injury  Pre-meal insulin inciting scale insulin with CBGs before meals and daily at bedtime #7 essential hypertension  Continue  antihypertensives  DVT prophylaxis: Lovenox Consultants: None Code Status: DO NOT RESUSCITATE Family Communication: None  Disposition Plan: Pending   Levie Heritage, DO [facility] Pager [phone]  If 7PM-7AM, please contact night-coverage [email or web address] Password [email or web address]

## 2016-09-16 NOTE — ED Triage Notes (Signed)
Pt reports she has been coughing for 2 weeks. Non productive majority of time. Pt feels short breath with exertion. Also reports throat and ears hurting

## 2016-09-16 NOTE — ED Provider Notes (Signed)
[facility]-EMERGENCY DEPT Provider Note   CSN: [id number] Arrival date & time: 09/16/16  1358     History   Chief Complaint Chief Complaint  Patient presents with  . Cough    HPI Kendra White is a 83 y.o. female.  HPI 83 y.o. Female presents complaining of cough, congestion, upper abdominal pain weakness for two weeks.  Symptoms began two weeks with ruq/chest pain radiating to right shoulder with nausea and "dry heaves".  She saw Dr. Charm BargesButler and was told she thought it was her gallbladder.  Patietn began having more nasal congestion and cough directly after this.  She states chest pain better, very thirsty, but not taking food well.  Denies uti symptoms, fever, diarrhea.  Endorses nasal congestion, sore throat, cough, general body aches and malaise.  She had flu shot and no known sick contacts.  Past Medical History:  Diagnosis Date  . Anxiety   . Blood transfusion   . Dependent edema   . Diabetes mellitus   . Headache(784.0)   . Hx-TIA (transient ischemic attack)   . Hypercholesteremia   . Swelling of ankle    and abdomen  . Ulcer (HCC)     There are no active problems to display for this patient.   Past Surgical History:  Procedure Laterality Date  . ABDOMINAL HYSTERECTOMY    . COLONOSCOPY    . DILATION AND CURETTAGE OF UTERUS    . LAPAROSCOPIC APPENDECTOMY  02/07/2011   Procedure: APPENDECTOMY LAPAROSCOPIC;  Surgeon: Dalia HeadingMark A Jenkins;  Location: [facility] ORS;  Service: General;  Laterality: N/A;  . POLYPECTOMY      OB History    No data available       Home Medications    Prior to Admission medications   Medication Sig Start Date End Date Taking? Authorizing Provider  acetaminophen (TYLENOL) 325 MG tablet Take 325 mg by mouth every 6 (six) hours as needed. For pain     Historical Provider, MD  ALPRAZolam (XANAX) 0.25 MG tablet Take 0.25 mg by mouth 2 (two) times daily.      Historical Provider, MD  aspirin 81 MG EC tablet Take 81 mg by mouth daily.      Historical  Provider, MD  Biotin 5000 MCG CAPS Take by mouth daily.    Historical Provider, MD  Calcium Carbonate-Vitamin D (CALCIUM + D PO) Take by mouth. 600/200mg -unit daily    Historical Provider, MD  citalopram (CELEXA) 10 MG tablet Take 10 mg by mouth daily.    Historical Provider, MD  fenofibrate 160 MG tablet Take 160 mg by mouth daily.      Historical Provider, MD  fish oil-omega-3 fatty acids 1000 MG capsule Take 2 g by mouth 2 (two) times daily.     Historical Provider, MD  Garlic 1000 MG CAPS Take 1,000 mg by mouth once.     Historical Provider, MD  hydrochlorothiazide 50 MG tablet Take 50 mg by mouth daily.      Historical Provider, MD  metFORMIN (GLUCOPHAGE) 500 MG tablet Take 500 mg by mouth daily with breakfast.      Historical Provider, MD  Multiple Vitamins-Minerals (MULTIVITAMIN WITH MINERALS) tablet Take 1 tablet by mouth daily.      Historical Provider, MD  niacin (NIASPAN) 500 MG CR tablet Take 1,000 mg by mouth 2 (two) times daily.      Historical Provider, MD  rosuvastatin (CRESTOR) 20 MG tablet Take 20 mg by mouth daily.      Historical Provider,  MD    Family History Family History  Problem Relation Age of Onset  . Colon cancer Father   . Colon polyps Maternal Aunt   . Pancreatic cancer Brother     Social History Social History  Substance Use Topics  . Smoking status: Never Smoker  . Smokeless tobacco: Never Used  . Alcohol use No     Allergies   Diphenhydramine hcl   Review of Systems Review of Systems   Physical Exam Updated Vital Signs BP 141/60 (BP Location: Left Arm)   Pulse 95   Temp 97.8 F (36.6 C) (Oral)   Resp 24   Ht 5\' 3"  (1.6 m)   Wt 71.7 kg   SpO2 98%   BMI 27.99 kg/m   Physical Exam  Constitutional: She is oriented to person, place, and time. She appears well-developed.  Uncomfortable, anxious, hoh  HENT:  Head: Normocephalic and atraumatic.  Right Ear: External ear normal.  Left Ear: External ear normal.  Mouth/Throat: Mucous  membranes are dry.  Eyes: EOM are normal. Pupils are equal, round, and reactive to light.  Neck: Normal range of motion. Neck supple.  Cardiovascular: Regular rhythm and normal heart sounds.   Pulmonary/Chest: Effort normal and breath sounds normal.  Abdominal: Soft. Bowel sounds are normal.  Musculoskeletal: Normal range of motion.  Neurological: She is alert and oriented to person, place, and time.  Skin: Skin is warm and dry. Capillary refill takes less than 2 seconds.  Psychiatric: She has a normal mood and affect.  Nursing note and vitals reviewed.    ED Treatments / Results  Labs (all labs ordered are listed, but only abnormal results are displayed) Labs Reviewed - No data to display  EKG  EKG Interpretation  Date/Time:  Friday September 16 2016 15:32:12 EST Ventricular Rate:  80 PR Interval:    QRS Duration: 81 QT Interval:  399 QTC Calculation: 461 R Axis:   31 Text Interpretation:  Sinus rhythm Prolonged PR interval Low voltage, precordial leads Consider anterior infarct No significant change since last tracing Confirmed by Samantha Ragen MD, Duwayne Heck [phone]) on 09/16/2016 3:58:09 PM       Radiology Dg Chest 2 View  Result Date: 09/16/2016 CLINICAL DATA:  Cough and fever EXAM: CHEST  2 VIEW COMPARISON:  02/07/2011 FINDINGS: Heart size within normal limits. Negative for heart failure. Atherosclerotic calcification in the aortic arch. Lungs are clear without infiltrate effusion or mass. No acute skeletal abnormality. IMPRESSION: No active cardiopulmonary disease. Electronically Signed   By: Marlan Palau M.D.   On: 09/16/2016 15:17    Procedures Procedures (including critical care time)  Medications Ordered in ED Medications - No data to display   Initial Impression / Assessment and Plan / ED Course  I have reviewed the triage vital signs and the nursing notes.  Pertinent labs & imaging results that were available during my care of the patient were reviewed by me and  considered in my medical decision making (see chart for details).   83 y.o. Female with cough, viral syndrome, poor appetite who presents today with increased weakness. 1- viral syndrome- symptoms present for two weeks, flu pending, no infiltrate on x-Austyn Seier. 2- volume depletion- patient with creatinine increased from baseline 0.51 to 1.62 today, bun at 56  Although taking po, I think she will benefit from gently parenteral hydration. 3- bs- elevat at 199, may also benefit from hydration, patient on  Metformin 4- hyponatremia 5- patient with recent episode of ruq pain, and although lft  normal now, ? gb US     Final Clinical Impressions(s) / ED Diagnoses   Final diagnoses:  Viral URI with cough  Hyponatremia  AKI (acute kidney injury) [facility])    New Prescriptions New Prescriptions   No medications on file     Margarita Grizzle, MD 09/16/16 2317

## 2016-09-16 NOTE — ED Notes (Signed)
Respiratory called for neb tx 

## 2016-09-16 NOTE — ED Notes (Signed)
ED Provider at bedside. 

## 2016-09-17 LAB — BASIC METABOLIC PANEL
Anion gap: 10 (ref 5–15)
BUN: 58 mg/dL — AB (ref 6–20)
CO2: 28 mmol/L (ref 22–32)
CREATININE: 1.67 mg/dL — AB (ref 0.44–1.00)
Calcium: 9.4 mg/dL (ref 8.9–10.3)
Chloride: 92 mmol/L — ABNORMAL LOW (ref 101–111)
GFR calc Af Amer: 32 mL/min — ABNORMAL LOW (ref >60)
GFR, EST NON AFRICAN AMERICAN: 27 mL/min — AB (ref >60)
GLUCOSE: 155 mg/dL — AB (ref 65–99)
Potassium: 3.9 mmol/L (ref 3.5–5.1)
SODIUM: 130 mmol/L — AB (ref 135–145)

## 2016-09-17 LAB — GLUCOSE, CAPILLARY
GLUCOSE-CAPILLARY: 159 mg/dL — AB (ref 65–99)
Glucose-Capillary: 144 mg/dL — ABNORMAL HIGH (ref 65–99)
Glucose-Capillary: 115 mg/dL — ABNORMAL HIGH (ref 65–99)

## 2016-09-17 LAB — CBC
HCT: 33.6 % — ABNORMAL LOW (ref 36.0–46.0)
Hemoglobin: 11.6 g/dL — ABNORMAL LOW (ref 12.0–15.0)
MCH: 31.5 pg (ref 26.0–34.0)
MCHC: 34.5 g/dL (ref 30.0–36.0)
MCV: 91.3 fL (ref 78.0–100.0)
PLATELETS: 306 10*3/uL (ref 150–400)
RBC: 3.68 MIL/uL — ABNORMAL LOW (ref 3.87–5.11)
RDW: 13.9 % (ref 11.5–15.5)
WBC: 11 10*3/uL — AB (ref 4.0–10.5)

## 2016-09-17 LAB — MAGNESIUM: Magnesium: 2 mg/dL (ref 1.7–2.4)

## 2016-09-17 LAB — PHOSPHORUS: PHOSPHORUS: 4.1 mg/dL (ref 2.5–4.6)

## 2016-09-17 MED ORDER — ROSUVASTATIN CALCIUM 10 MG PO TABS
10.0000 mg | ORAL_TABLET | Freq: Every day | ORAL | Status: DC
  Administered 2016-09-18 – 2016-09-20 (×3): 10 mg via ORAL
  Filled 2016-09-17 (×3): qty 1

## 2016-09-17 MED ORDER — FAMOTIDINE 20 MG PO TABS
20.0000 mg | ORAL_TABLET | Freq: Every day | ORAL | Status: DC
  Administered 2016-09-18 – 2016-09-21 (×4): 20 mg via ORAL
  Filled 2016-09-17 (×4): qty 1

## 2016-09-17 MED ORDER — CITALOPRAM HYDROBROMIDE 20 MG PO TABS
20.0000 mg | ORAL_TABLET | Freq: Every day | ORAL | Status: DC
  Administered 2016-09-18 – 2016-09-21 (×4): 20 mg via ORAL
  Filled 2016-09-17 (×4): qty 1

## 2016-09-17 NOTE — Progress Notes (Signed)
PROGRESS NOTE    Kendra White  ZOX:[id number] DOB: 05/27/1931 DOA: 09/16/2016 PCP: Samuel JesterYNTHIA BUTLER, DO   Brief Narrative:  Kendra White is a 83 y.o. female with a history of hypertension, anxiety, diabetes, dependent edema on Lasix, hyperlipidemia, history of TIA. Patient presents with complaints of cough, congestion, weakness for the past 2 weeks. Her symptoms started after developing right upper quadrant pain that radiated into her right shoulder 2 weeks ago that was accompanied with nausea and dry heaves (should be noted that while the patient points to right upper quadrant, the patient states "chest pain"). She saw her doctor, who thought that her pain was due to her gallbladder. The patient states that she was going to be referred to general surgery. Shortly after, her pain improved, but then became having more nasal congestion and coughing. She's had little oral intake both liquid and solid. She has been dry heaving. Admitted for Dehydration and AKI.  Assessment & Plan:   Principal Problem:   Acute renal insufficiency Active Problems:   Dehydration   Hyponatremia   Hypokalemia   Diabetes mellitus (HCC)   Essential hypertension   Cough  AKI; ?CKD -C/w NS + 40 mEQ Kcl at 75 mL/hr -BUN/Cr went from 56/1.62 -> 58/1.67 -Obtain Renal U/S -Repeat CMP in AM  Dehydration -Likely from poor po Intake, diruetic use and possibly Viral Syndrome -C/w Antiemetics with Zofran 4 mg po/IV -C/w NS + 40 mEQ Kcl at 75 mL/hr -Hold Home Lasix and will D/C HCTZ  Hyponatremia -Sodium went from 127->130 -Likely 2/2 to Dehydration and Diuretic Use -D/C'd HCTZ; Furosemide not ordered on Admission -Repeat CMP in AM  Hypokalemia -Improved. K+ went from 2.9 -> 3.9 -C/w NS + 40 mEQ Kcl at 75 mL/hr  Dry Cough -Likely a Viral Syndrome -Influenza PCR was Negative -Continue to Monitor as CXR showed no Active Cardiopulmonary Disease  Diabetes Mellitus Type 2 -Hold Metformin 500 mg po Dialy -C/w  Moderate Novolog SSI AC HS  Hypertension -C/w Propanolol 20 mg po BID -Hold HCTZ 50 mg po Daily and Furosemide 40 mg po Daily  Depression and Anxiety -C/w Alprazolam 0.125-0.25 mg po TIDprn and with Citalopram 40 mg po Daily  Sciatica -Recently received Steroid Injection  Leukocytosis -Improving as WBC went from 12.1 -> 11.0 -Repeat CBC in AM  DVT prophylaxis: Lovenox 40 mg sq Code Status: DO NOT RESUSCITATE Family Communication: No family present at bedside Disposition Plan:  Will obtain PT Eval  Consultants:  None   Procedures: None  Antimicrobials: Anti-infectives    None     Subjective: Seen and examined and was feeling better. No Nausea or Vomiting. States cough has resolved. No other complaints or concerns and feels stronger.  Objective: Vitals:   09/16/16 1915 09/16/16 1930 09/16/16 2023 09/17/16 0500  BP:  (!) 116/53 (!) 122/50 (!) 123/51  Pulse: 73 73 72 (!) 59  Resp: 15 (!) 9 18 18   Temp:   98.5 F (36.9 C) 98.1 F (36.7 C)  TempSrc:   Oral Oral  SpO2: 96% 95% 96% 98%  Weight:      Height:   5\' 3"  (1.6 m)     Intake/Output Summary (Last 24 hours) at 09/17/16 0730 Last data filed at 09/17/16 0300  Gross per 24 hour  Intake             1040 ml  Output               30 ml  Net  1010 ml   Filed Weights   09/16/16 1408  Weight: 71.7 kg (158 lb)   Examination: Physical Exam:  Constitutional: NAD and appears calm and comfortable Eyes: Lids and conjunctivae normal, sclerae anicteric  ENMT: External Ears, Nose appear normal. Grossly normal hearing.  Neck: Appears normal, supple, no cervical masses, normal ROM, no appreciable thyromegaly Respiratory: Diminished to auscultation bilaterally, no wheezing, rales, rhonchi or crackles. Normal respiratory effort and patient is not tachypenic. No accessory muscle use.  Cardiovascular: RRR, no murmurs / rubs / gallops. S1 and S2 auscultated. Mild extremity edema Abdomen: Soft, non-tender,  non-distended. No masses palpated. No appreciable hepatosplenomegaly. Bowel sounds positive x4.  GU: Deferred. Musculoskeletal: No clubbing / cyanosis of digits/nails. No joint deformity upper and lower extremities.  Skin: No rashes, lesions, ulcers on limited skin evaluation. No induration; Warm and dry.  Neurologic: CN 2-12 grossly intact with no focal deficits. Sensation intact in all 4. Romberg sign cerebellar reflexes not assessed.  Psychiatric: Normal judgment and insight. Alert and oriented x 3. Normal mood and appropriate affect.   Data Reviewed: I have personally reviewed following labs and imaging studies  CBC:  Recent Labs Lab 09/16/16 1534  WBC 12.1*  NEUTROABS 7.5  HGB 12.9  HCT 36.9  MCV 89.8  PLT 315   Basic Metabolic Panel:  Recent Labs Lab 09/16/16 1534  NA 127*  K 2.9*  CL 89*  CO2 25  GLUCOSE 199*  BUN 56*  CREATININE 1.62*  CALCIUM 9.9   GFR: Estimated Creatinine Clearance: 25 mL/min (by C-G formula based on SCr of 1.62 mg/dL (H)). Liver Function Tests:  Recent Labs Lab 09/16/16 1534  AST 25  ALT 16  ALKPHOS 54  BILITOT 0.7  PROT 8.0  ALBUMIN 3.5    Recent Labs Lab 09/16/16 1534  LIPASE 31   No results for input(s): AMMONIA in the last 168 hours. Coagulation Profile: No results for input(s): INR, PROTIME in the last 168 hours. Cardiac Enzymes: No results for input(s): CKTOTAL, CKMB, CKMBINDEX, TROPONINI in the last 168 hours. BNP (last 3 results) No results for input(s): PROBNP in the last 8760 hours. HbA1C: No results for input(s): HGBA1C in the last 72 hours. CBG:  Recent Labs Lab 09/16/16 2226  GLUCAP 203*   Lipid Profile: No results for input(s): CHOL, HDL, LDLCALC, TRIG, CHOLHDL, LDLDIRECT in the last 72 hours. Thyroid Function Tests: No results for input(s): TSH, T4TOTAL, FREET4, T3FREE, THYROIDAB in the last 72 hours. Anemia Panel: No results for input(s): VITAMINB12, FOLATE, FERRITIN, TIBC, IRON, RETICCTPCT in the  last 72 hours. Sepsis Labs: No results for input(s): PROCALCITON, LATICACIDVEN in the last 168 hours.  No results found for this or any previous visit (from the past 240 hour(s)).   Radiology Studies: Dg Chest 2 View  Result Date: 09/16/2016 CLINICAL DATA:  Cough and fever EXAM: CHEST  2 VIEW COMPARISON:  02/07/2011 FINDINGS: Heart size within normal limits. Negative for heart failure. Atherosclerotic calcification in the aortic arch. Lungs are clear without infiltrate effusion or mass. No acute skeletal abnormality. IMPRESSION: No active cardiopulmonary disease. Electronically Signed   By: Marlan Palau M.D.   On: 09/16/2016 15:17   Scheduled Meds: . ALPRAZolam  0.25 mg Oral BID  . aspirin EC  81 mg Oral Daily  . citalopram  40 mg Oral Daily  . cycloSPORINE  1 drop Both Eyes BID  . enoxaparin (LOVENOX) injection  30 mg Subcutaneous Q24H  . famotidine  20 mg Oral BID  . fenofibrate  160 mg Oral Daily  . hydrochlorothiazide  50 mg Oral Daily  . insulin aspart  0-15 Units Subcutaneous TID WC  . insulin aspart  0-5 Units Subcutaneous QHS  . insulin aspart  4 Units Subcutaneous TID WC  . niacin  1,000 mg Oral BID  . primidone  50 mg Oral BID  . propranolol  40 mg Oral Daily  . rosuvastatin  20 mg Oral q1800   Continuous Infusions: . 0.9 % NaCl with KCl 40 mEq / L 75 mL/hr (09/16/16 2222)    LOS: 1 day   Merlene Laughter, DO [facility] Pager [phone]  If 7PM-7AM, please contact night-coverage [email or web address] Password [email or web address] 09/17/2016, 7:30 AM

## 2016-09-18 ENCOUNTER — Inpatient Hospital Stay (HOSPITAL_COMMUNITY): Payer: Medicare Other

## 2016-09-18 DIAGNOSIS — J019 Acute sinusitis, unspecified: Secondary | ICD-10-CM

## 2016-09-18 DIAGNOSIS — H5789 Other specified disorders of eye and adnexa: Secondary | ICD-10-CM

## 2016-09-18 LAB — COMPREHENSIVE METABOLIC PANEL
ALK PHOS: 49 U/L (ref 38–126)
ALT: 19 U/L (ref 14–54)
ANION GAP: 5 (ref 5–15)
AST: 20 U/L (ref 15–41)
Albumin: 3 g/dL — ABNORMAL LOW (ref 3.5–5.0)
BUN: 36 mg/dL — ABNORMAL HIGH (ref 6–20)
CO2: 28 mmol/L (ref 22–32)
Calcium: 8.7 mg/dL — ABNORMAL LOW (ref 8.9–10.3)
Chloride: 100 mmol/L — ABNORMAL LOW (ref 101–111)
Creatinine, Ser: 1.02 mg/dL — ABNORMAL HIGH (ref 0.44–1.00)
GFR calc Af Amer: 57 mL/min — ABNORMAL LOW (ref >60)
GFR calc non Af Amer: 49 mL/min — ABNORMAL LOW (ref >60)
GLUCOSE: 168 mg/dL — AB (ref 65–99)
POTASSIUM: 4.4 mmol/L (ref 3.5–5.1)
SODIUM: 133 mmol/L — AB (ref 135–145)
Total Bilirubin: 0.4 mg/dL (ref 0.3–1.2)
Total Protein: 6.6 g/dL (ref 6.5–8.1)

## 2016-09-18 LAB — CBC WITH DIFFERENTIAL/PLATELET
Basophils Absolute: 0.1 10*3/uL (ref 0.0–0.1)
Basophils Relative: 1 %
Eosinophils Absolute: 0.4 10*3/uL (ref 0.0–0.7)
Eosinophils Relative: 4 %
HCT: 32.9 % — ABNORMAL LOW (ref 36.0–46.0)
HEMOGLOBIN: 11.2 g/dL — AB (ref 12.0–15.0)
Lymphocytes Relative: 23 %
Lymphs Abs: 2.5 10*3/uL (ref 0.7–4.0)
MCH: 31.5 pg (ref 26.0–34.0)
MCHC: 34 g/dL (ref 30.0–36.0)
MCV: 92.4 fL (ref 78.0–100.0)
MONOS PCT: 11 %
Monocytes Absolute: 1.2 10*3/uL — ABNORMAL HIGH (ref 0.1–1.0)
NEUTROS PCT: 61 %
Neutro Abs: 6.7 10*3/uL (ref 1.7–7.7)
Platelets: 297 10*3/uL (ref 150–400)
RBC: 3.56 MIL/uL — ABNORMAL LOW (ref 3.87–5.11)
RDW: 13.8 % (ref 11.5–15.5)
WBC: 10.8 10*3/uL — AB (ref 4.0–10.5)

## 2016-09-18 LAB — GLUCOSE, CAPILLARY
GLUCOSE-CAPILLARY: 174 mg/dL — AB (ref 65–99)
GLUCOSE-CAPILLARY: 119 mg/dL — AB (ref 65–99)
GLUCOSE-CAPILLARY: 138 mg/dL — AB (ref 65–99)

## 2016-09-18 LAB — PHOSPHORUS: Phosphorus: 2.2 mg/dL — ABNORMAL LOW (ref 2.5–4.6)

## 2016-09-18 LAB — MAGNESIUM: Magnesium: 2 mg/dL (ref 1.7–2.4)

## 2016-09-18 MED ORDER — AMOXICILLIN-POT CLAVULANATE 875-125 MG PO TABS
1.0000 | ORAL_TABLET | Freq: Two times a day (BID) | ORAL | Status: DC
  Administered 2016-09-18 – 2016-09-19 (×4): 1 via ORAL
  Filled 2016-09-18 (×4): qty 1

## 2016-09-18 MED ORDER — FLUTICASONE PROPIONATE 50 MCG/ACT NA SUSP
2.0000 | Freq: Every day | NASAL | Status: DC
  Administered 2016-09-18 – 2016-09-21 (×4): 2 via NASAL
  Filled 2016-09-18 (×2): qty 16

## 2016-09-18 MED ORDER — LORATADINE 10 MG PO TABS
10.0000 mg | ORAL_TABLET | Freq: Every day | ORAL | Status: DC
  Administered 2016-09-18 – 2016-09-21 (×4): 10 mg via ORAL
  Filled 2016-09-18 (×4): qty 1

## 2016-09-18 MED ORDER — SODIUM CHLORIDE 0.9 % IV SOLN
INTRAVENOUS | Status: DC
  Administered 2016-09-18 – 2016-09-21 (×6): via INTRAVENOUS

## 2016-09-18 MED ORDER — ACETAMINOPHEN 650 MG RE SUPP: 650.0000 mg | Freq: Four times a day (QID) | RECTAL | Status: DC | PRN

## 2016-09-18 MED ORDER — ACETAMINOPHEN 325 MG PO TABS
650.0000 mg | ORAL_TABLET | Freq: Four times a day (QID) | ORAL | Status: DC | PRN
  Administered 2016-09-19 – 2016-09-20 (×3): 650 mg via ORAL
  Filled 2016-09-18 (×3): qty 2

## 2016-09-18 MED ORDER — NAPHAZOLINE-GLYCERIN 0.012-0.2 % OP SOLN
1.0000 [drp] | Freq: Four times a day (QID) | OPHTHALMIC | Status: DC | PRN
  Filled 2016-09-18: qty 15

## 2016-09-18 MED ORDER — DM-GUAIFENESIN ER 30-600 MG PO TB12
1.0000 | ORAL_TABLET | Freq: Two times a day (BID) | ORAL | Status: DC
  Administered 2016-09-18 – 2016-09-21 (×6): 1 via ORAL
  Filled 2016-09-18 (×6): qty 1

## 2016-09-18 MED ORDER — ENOXAPARIN SODIUM 40 MG/0.4ML ~~LOC~~ SOLN
40.0000 mg | SUBCUTANEOUS | Status: DC
  Administered 2016-09-18 – 2016-09-20 (×3): 40 mg via SUBCUTANEOUS
  Filled 2016-09-18 (×3): qty 0.4

## 2016-09-18 NOTE — Progress Notes (Signed)
PROGRESS NOTE    Kendra White  WJX:[id number] DOB: 14-Jan-1931 DOA: 09/16/2016 PCP: Samuel Jester, DO   Brief Narrative:  Kendra White is a 83 y.o. female with a history of hypertension, anxiety, diabetes, dependent edema on Lasix, hyperlipidemia, history of TIA. Patient presents with complaints of cough, congestion, weakness for the past 2 weeks. Her symptoms started after developing right upper quadrant pain that radiated into her right shoulder 2 weeks ago that was accompanied with nausea and dry heaves (should be noted that while the patient points to right upper quadrant, the patient states "chest pain"). She saw her doctor, who thought that her pain was due to her gallbladder. The patient states that she was going to be referred to general surgery. Shortly after, her pain improved, but then became having more nasal congestion and coughing. She's had little oral intake both liquid and solid. She has been dry heaving. Admitted for Dehydration and AKI and found to have Acute Sinusitis.   Assessment & Plan:   Principal Problem:   Acute renal insufficiency Active Problems:   Dehydration   Hyponatremia   Hypokalemia   Diabetes mellitus (HCC)   Essential hypertension   Cough  AKI -D/C'd NS + 40 mEQ Kcl at 75 mL/hr and started NS at 75 mL/hr without KCl -BUN/Cr went from 56/1.62 -> 58/1.67 -> 36/1.02 with IVF Rehydration -Obtained Renal U/S and was a normal Renal ultrasound -Avoid Nephrotoxics and Held Furosemide and HCTZ -Repeat CMP in AM  Dehydration -Likely from poor po Intake, diruetic use and possibly Viral Syndrome/Acute Sinusitis  -C/w Antiemetics with Zofran 4 mg po/IV -D/C'd NS + 40 mEQ Kcl at 75 mL/hr, Started NS at 75 mL without Kcl -Hold Home Lasix and HCTZ -Continue to Monitor Volume Status  Acute Sinusitis -Cough likely from postnasal drip -Influenza PCR was Negative; Will obtain Viral Respiratory Panel; Droplet Precautions -CT Maxillofacial w/o Contrast showed  Acute Sinusitis with Fluid Levels in the Left Maxillary and BiFrontal Sinuses with Bilateral Mastoid Opacification. -Started Patient on Augmentin 875-125 mg po BID -Added Flonase 2 spray in Each Nare po Daily -C/w Supportive Care with Acetaminophen 650 mg po q6hprn for Mild Pain or Fever >101 -Continue to Monitor as CXR showed no Active Cardiopulmonary Disease  Leukocytosis likely 2/2 to Acute Sinusitis -Improving as WBC went from 12.1 -> 11.0 -> 10.8 -CT Maxillofacial without Contrast showed Acute Sinusitis with Fluid levels in the Left Maxillary and Bifrontal Sinuses and Bilateral Mastoid Opacification -Started patient on Augmentin 875-125 mg po BID -Check Viral Respiratory Panel and place on Droplet Precautions -Started patient on Flonase 2 spray each Nare po Daily -Repeat CBC in AM  Hyponatremia, improving -Sodium went from 127->130 -> 133; Improved with IVF Rehydration -Likely 2/2 to Dehydration and Diuretic Use -D/C'd HCTZ; Furosemide not ordered on Admission -Repeat CMP in AM  Hypokalemia -Improved. K+ went from 2.9 -> 3.9 -> 4.4 -D/C'd NS + 40 mEQ Kcl at 75 mL/hr; Started NS at 75 mL without Kcl -Repeat CMP in AM  Diabetes Mellitus Type 2 -Hold Metformin 500 mg po Dialy -C/w Moderate Novolog SSI AC HS -CBG's ranging from 115-174  Hypertension -C/w Propanolol 20 mg po BID -Hold HCTZ 50 mg po Daily and Furosemide 40 mg po Daily  Depression and Anxiety -C/w Alprazolam 0.125-0.25 mg po TIDprn and with Citalopram 40 mg po Daily  Sciatica -Recently received Steroid Injection  Eye Redness -Held Patient's Restasis and started Visine  DVT prophylaxis: Lovenox 40 mg sq Code Status: DO  NOT RESUSCITATE Family Communication: No family present at bedside Disposition Plan:  Will obtain PT Eval  Consultants:  None   Procedures: None  Antimicrobials: Anti-infectives    None     Subjective: Seen and examined and stated she felt worse and was having pain and pressure  in her sinuses. States she had some abdominal pain from coughing but it resolved. Stated she had a headache and felt fatigued. No Nausea or Vomiting. No other concerns or complaints at this time.   Objective: Vitals:   09/17/16 0500 09/17/16 1435 09/17/16 2112 09/18/16 0500  BP: (!) 123/51 (!) 114/50 (!) 119/52 (!) 142/58  Pulse: (!) 59 64 65 72  Resp: 18 16 18 18   Temp: 98.1 F (36.7 C) 98.3 F (36.8 C) 98.8 F (37.1 C) 98.6 F (37 C)  TempSrc: Oral Oral Oral Oral  SpO2: 98% 96% 97% 98%  Weight:      Height:        Intake/Output Summary (Last 24 hours) at 09/18/16 0731 Last data filed at 09/18/16 0315  Gross per 24 hour  Intake          2658.75 ml  Output                0 ml  Net          2658.75 ml   Filed Weights   09/16/16 1408  Weight: 71.7 kg (158 lb)   Examination: Physical Exam:  Constitutional: NAD and appears mildly uncomfortable.  Eyes: Lids normal and conjunctivae erythematous, sclerae anicteric  ENMT: External Ears, Nose appear normal. Grossly normal hearing. Pain on facial palpation.  Neck: Appears normal, supple, no cervical masses, normal ROM, no appreciable thyromegaly Respiratory: Diminished to auscultation bilaterally, no wheezing, rales, rhonchi or crackles. Normal respiratory effort and patient is not tachypenic. No accessory muscle use.  Cardiovascular: RRR, no murmurs / rubs / gallops. S1 and S2 auscultated. Mild extremity edema Abdomen: Soft, non-tender, non-distended. No masses palpated. No appreciable hepatosplenomegaly. Bowel sounds positive x4.  GU: Deferred. Musculoskeletal: No clubbing / cyanosis of digits/nails. No joint deformity upper and lower extremities.  Skin: No rashes, lesions, ulcers on limited skin evaluation. No induration; Warm and dry.  Neurologic: CN 2-12 grossly intact with no focal deficits. Sensation intact in all 4. Romberg sign cerebellar reflexes not assessed.  Psychiatric: Normal judgment and insight. Alert and oriented x  3. Normal mood and appropriate affect.   Data Reviewed: I have personally reviewed following labs and imaging studies  CBC:  Recent Labs Lab 09/16/16 1534 09/17/16 0621 09/18/16 0545  WBC 12.1* 11.0* 10.8*  NEUTROABS 7.5  --  6.7  HGB 12.9 11.6* 11.2*  HCT 36.9 33.6* 32.9*  MCV 89.8 91.3 92.4  PLT 315 306 297   Basic Metabolic Panel:  Recent Labs Lab 09/16/16 1534 09/17/16 0621 09/18/16 0545  NA 127* 130* 133*  K 2.9* 3.9 4.4  CL 89* 92* 100*  CO2 25 28 28   GLUCOSE 199* 155* 168*  BUN 56* 58* 36*  CREATININE 1.62* 1.67* 1.02*  CALCIUM 9.9 9.4 8.7*  MG  --  2.0 2.0  PHOS  --  4.1 2.2*   GFR: Estimated Creatinine Clearance: 39.6 mL/min (by C-G formula based on SCr of 1.02 mg/dL (H)). Liver Function Tests:  Recent Labs Lab 09/16/16 1534 09/18/16 0545  AST 25 20  ALT 16 19  ALKPHOS 54 49  BILITOT 0.7 0.4  PROT 8.0 6.6  ALBUMIN 3.5 3.0*    Recent Labs  Lab 09/16/16 1534  LIPASE 31   No results for input(s): AMMONIA in the last 168 hours. Coagulation Profile: No results for input(s): INR, PROTIME in the last 168 hours. Cardiac Enzymes: No results for input(s): CKTOTAL, CKMB, CKMBINDEX, TROPONINI in the last 168 hours. BNP (last 3 results) No results for input(s): PROBNP in the last 8760 hours. HbA1C: No results for input(s): HGBA1C in the last 72 hours. CBG:  Recent Labs Lab 09/16/16 2226 09/17/16 0754 09/17/16 1146 09/17/16 1614  GLUCAP 203* 159* 144* 115*   Lipid Profile: No results for input(s): CHOL, HDL, LDLCALC, TRIG, CHOLHDL, LDLDIRECT in the last 72 hours. Thyroid Function Tests: No results for input(s): TSH, T4TOTAL, FREET4, T3FREE, THYROIDAB in the last 72 hours. Anemia Panel: No results for input(s): VITAMINB12, FOLATE, FERRITIN, TIBC, IRON, RETICCTPCT in the last 72 hours. Sepsis Labs: No results for input(s): PROCALCITON, LATICACIDVEN in the last 168 hours.  No results found for this or any previous visit (from the past 240  hour(s)).   Radiology Studies: Dg Chest 2 View  Result Date: 09/16/2016 CLINICAL DATA:  Cough and fever EXAM: CHEST  2 VIEW COMPARISON:  02/07/2011 FINDINGS: Heart size within normal limits. Negative for heart failure. Atherosclerotic calcification in the aortic arch. Lungs are clear without infiltrate effusion or mass. No acute skeletal abnormality. IMPRESSION: No active cardiopulmonary disease. Electronically Signed   By: Marlan Palauharles  Clark M.D.   On: 09/16/2016 15:17   Scheduled Meds: . ALPRAZolam  0.25 mg Oral BID  . aspirin EC  81 mg Oral Daily  . citalopram  20 mg Oral Daily  . cycloSPORINE  1 drop Both Eyes BID  . enoxaparin (LOVENOX) injection  30 mg Subcutaneous Q24H  . famotidine  20 mg Oral Daily  . insulin aspart  0-15 Units Subcutaneous TID WC  . insulin aspart  0-5 Units Subcutaneous QHS  . insulin aspart  4 Units Subcutaneous TID WC  . niacin  1,000 mg Oral BID  . primidone  50 mg Oral BID  . propranolol  40 mg Oral Daily  . rosuvastatin  10 mg Oral q1800   Continuous Infusions: . 0.9 % NaCl with KCl 40 mEq / L 75 mL/hr (09/18/16 0657)    LOS: 2 days   Merlene Laughtermair Latif Sheikh, DO [facility] Pager [phone]  If 7PM-7AM, please contact night-coverage [email or web address] Password [email or web address] 09/18/2016, 7:31 AM

## 2016-09-19 DIAGNOSIS — K1379 Other lesions of oral mucosa: Secondary | ICD-10-CM

## 2016-09-19 LAB — COMPREHENSIVE METABOLIC PANEL
ALT: 14 U/L (ref 14–54)
AST: 16 U/L (ref 15–41)
Albumin: 2.7 g/dL — ABNORMAL LOW (ref 3.5–5.0)
Alkaline Phosphatase: 46 U/L (ref 38–126)
Anion gap: 7 (ref 5–15)
BILIRUBIN TOTAL: 0.3 mg/dL (ref 0.3–1.2)
BUN: 19 mg/dL (ref 6–20)
CHLORIDE: 101 mmol/L (ref 101–111)
CO2: 25 mmol/L (ref 22–32)
Calcium: 8.3 mg/dL — ABNORMAL LOW (ref 8.9–10.3)
Creatinine, Ser: 0.94 mg/dL (ref 0.44–1.00)
GFR, EST NON AFRICAN AMERICAN: 55 mL/min — AB (ref >60)
Glucose, Bld: 149 mg/dL — ABNORMAL HIGH (ref 65–99)
POTASSIUM: 4.1 mmol/L (ref 3.5–5.1)
Sodium: 133 mmol/L — ABNORMAL LOW (ref 135–145)
TOTAL PROTEIN: 6.3 g/dL — AB (ref 6.5–8.1)

## 2016-09-19 LAB — PHOSPHORUS: PHOSPHORUS: 1.7 mg/dL — AB (ref 2.5–4.6)

## 2016-09-19 LAB — GLUCOSE, CAPILLARY
GLUCOSE-CAPILLARY: 141 mg/dL — AB (ref 65–99)
GLUCOSE-CAPILLARY: 132 mg/dL — AB (ref 65–99)
GLUCOSE-CAPILLARY: 152 mg/dL — AB (ref 65–99)
GLUCOSE-CAPILLARY: 113 mg/dL — AB (ref 65–99)
Glucose-Capillary: 156 mg/dL — ABNORMAL HIGH (ref 65–99)

## 2016-09-19 LAB — CBC WITH DIFFERENTIAL/PLATELET
BASOS ABS: 0.1 10*3/uL (ref 0.0–0.1)
Basophils Relative: 1 %
EOS PCT: 3 %
Eosinophils Absolute: 0.3 10*3/uL (ref 0.0–0.7)
HEMATOCRIT: 31.9 % — AB (ref 36.0–46.0)
Hemoglobin: 10.7 g/dL — ABNORMAL LOW (ref 12.0–15.0)
LYMPHS ABS: 2.5 10*3/uL (ref 0.7–4.0)
LYMPHS PCT: 21 %
MCH: 31.5 pg (ref 26.0–34.0)
MCHC: 33.5 g/dL (ref 30.0–36.0)
MCV: 93.8 fL (ref 78.0–100.0)
MONO ABS: 1.3 10*3/uL — AB (ref 0.1–1.0)
Monocytes Relative: 11 %
Neutro Abs: 7.4 10*3/uL (ref 1.7–7.7)
Neutrophils Relative %: 64 %
PLATELETS: 307 10*3/uL (ref 150–400)
RBC: 3.4 MIL/uL — ABNORMAL LOW (ref 3.87–5.11)
RDW: 14.1 % (ref 11.5–15.5)
WBC: 11.6 10*3/uL — ABNORMAL HIGH (ref 4.0–10.5)

## 2016-09-19 LAB — MAGNESIUM: MAGNESIUM: 1.9 mg/dL (ref 1.7–2.4)

## 2016-09-19 NOTE — Care Management Note (Addendum)
Case Management Note  Patient Details  Name: Kendra RidgesMarie M White MRN: [id number] Date of Birth: 07/09/1931  Subjective/Objective:  Adm with acute renal insufficiency. From home with husband, reports ind with ADL's. No DME or HH PTA. She has PCP, drives to appointments and has no issues affording medications. She has had Home health with [facility] in the past. PT eval pending. [facility], she is agreeable and would like [facility].           Action/Plan: CM following for needs.   09/20/2016: PT recommends HH PT. Patient agreeable. Vickie of [facility] notified and will obtain orders from chart. [facility] and face to face ordered prior to discharge.   Expected Discharge Date:     09/20/2016             Expected Discharge Plan:  Home w Home Health Services  In-House Referral:     Discharge planning Services  CM Consult  Post Acute Care Choice:    Choice offered to:     DME Arranged:    DME Agency:     HH Arranged:    HH Agency:     Status of Service:  In process, will continue to follow  If discussed at Long Length of Stay Meetings, dates discussed:    Additional Comments:  Rupert Azzara, Chrystine OilerSharley Diane, RN 09/19/2016, 1:08 PM

## 2016-09-19 NOTE — Progress Notes (Signed)
PROGRESS NOTE    Kendra White  ZOX:[id number] DOB: 1931/06/25 DOA: 09/16/2016 PCP: Samuel Jester, DO   Brief Narrative:  Kendra White is a 83 y.o. female with a history of hypertension, anxiety, diabetes, dependent edema on Lasix, hyperlipidemia, history of TIA. Patient presents with complaints of cough, congestion, weakness for the past 2 weeks. Her symptoms started after developing right upper quadrant pain that radiated into her right shoulder 2 weeks ago that was accompanied with nausea and dry heaves (should be noted that while the patient points to right upper quadrant, the patient states "chest pain"). She saw her doctor, who thought that her pain was due to her gallbladder. The patient states that she was going to be referred to general surgery. Shortly after, her pain improved, but then became having more nasal congestion and coughing. She's had little oral intake both liquid and solid. She has been dry heaving. Admitted for Dehydration and AKI and found to have Acute Sinusitis. PT consulted and recommends Home Health PT.  Assessment & Plan:   Principal Problem:   Acute renal insufficiency Active Problems:   Dehydration   Hyponatremia   Hypokalemia   Diabetes mellitus (HCC)   Essential hypertension   Cough   Acute sinusitis   Eye redness  AKI -C/w NS at 75 mL/hr without KCl -BUN/Cr went from 56/1.62 -> 58/1.67 -> 36/1.02 -> 19/0.94 with IVF Rehydration -Obtained Renal U/S and was a normal Renal ultrasound -Avoid Nephrotoxics and Held Furosemide and HCTZ -Repeat CMP in AM  Dehydration -Likely from poor po Intake, diruetic use and possibly Viral Syndrome/Acute Sinusitis  -C/w Antiemetics with Zofran 4 mg po/IV -C/w NS at 75 mL without Kcl -Hold Home Lasix and HCTZ -Continue to Monitor Volume Status  Acute Sinusitis -Cough likely from postnasal drip; Complained of Mouth Soreness today (no Erythema noted) -Influenza PCR was Negative; Will obtain Viral Respiratory Panel;  Droplet Precautions -CT Maxillofacial w/o Contrast showed Acute Sinusitis with Fluid Levels in the Left Maxillary and BiFrontal Sinuses with Bilateral Mastoid Opacification. -Started Patient on Augmentin 875-125 mg po BID -Added Flonase 2 spray in Each Nare po Daily -C/w Chloraseptic Spray -C/w Supportive Care with Acetaminophen 650 mg po q6hprn for Mild Pain or Fever >101 -Continue to Monitor as CXR showed no Active Cardiopulmonary Disease  Leukocytosis likely 2/2 to Acute Sinusitis -WBC went from 12.1 -> 11.0 -> 10.8 -> 11.6 -CT Maxillofacial without Contrast showed Acute Sinusitis with Fluid levels in the Left Maxillary and Bifrontal Sinuses and Bilateral Mastoid Opacification -Started patient on Augmentin 875-125 mg po BID -Check Viral Respiratory Panel and place on Droplet Precautions -Started patient on Flonase 2 spray each Nare po Daily -Repeat CBC in AM  Hyponatremia, stable -Sodium went from 127->130 -> 133 -> 133; Improved with IVF Rehydration -Likely 2/2 to Dehydration and Diuretic Use -D/C'd HCTZ; Furosemide not ordered on Admission -Repeat CMP in AM  Hypokalemia -Improved. K+ now 4.1 -Repeat CMP in AM  Diabetes Mellitus Type 2 -Hold Metformin 500 mg po Dialy -C/w Moderate Novolog SSI AC HS -CBG's ranging from 113-152  Hypertension -C/w Propanolol 20 mg po BID -Hold HCTZ 50 mg po Daily and Furosemide 40 mg po Daily  Depression and Anxiety -C/w Alprazolam 0.125-0.25 mg po TIDprn and with Citalopram 40 mg po Daily  Sciatica -Recently received Steroid Injection  Eye Redness -Held Patient's Restasis and started Visine  DVT prophylaxis: Lovenox 40 mg sq Code Status: DO NOT RESUSCITATE Family Communication: No family present at bedside Disposition Plan:  Home Health PT at D/C; Likely in AM  Consultants:  None   Procedures: None  Antimicrobials: Anti-infectives    Start     Dose/Rate Route Frequency Ordered Stop   09/18/16 1515  amoxicillin-clavulanate  (AUGMENTIN) 875-125 MG per tablet 1 tablet     1 tablet Oral Every 12 hours 09/18/16 1503       Subjective: Seen and examined and stated her mouth was sore and that her jaw still hurt. No nausea or vomiting. No cough. No Cp. No other concerns or complaints at this time.   Objective: Vitals:   09/18/16 1452 09/18/16 2043 09/19/16 0522 09/19/16 1447  BP: (!) 121/50 (!) 126/51 (!) 127/53 (!) 123/47  Pulse: 67 71 72 76  Resp: 18 18 18 18   Temp: 98.8 F (37.1 C) 98.7 F (37.1 C) 99.5 F (37.5 C) 99 F (37.2 C)  TempSrc: Oral Oral Oral   SpO2: 97% 98% 97% 97%  Weight:      Height:        Intake/Output Summary (Last 24 hours) at 09/19/16 1709 Last data filed at 09/19/16 0300  Gross per 24 hour  Intake              870 ml  Output                0 ml  Net              870 ml   Filed Weights   09/16/16 1408  Weight: 71.7 kg (158 lb)   Examination: Physical Exam:  Constitutional: NAD and appears mildly uncomfortable.  Eyes: Lids normal and conjunctivae erythematous, sclerae anicteric  ENMT: External Ears, Nose appear normal. Grossly normal hearing. Pain on facial palpation. Mouth without erythema and mucous membranes moist.  Neck: Appears normal, supple, no cervical masses, normal ROM, no appreciable thyromegaly Respiratory: Diminished to auscultation bilaterally, no wheezing, rales, rhonchi or crackles. Normal respiratory effort and patient is not tachypenic. No accessory muscle use.  Cardiovascular: RRR, no murmurs / rubs / gallops. S1 and S2 auscultated. Mild extremity edema Abdomen: Soft, non-tender, non-distended. No masses palpated. No appreciable hepatosplenomegaly. Bowel sounds positive x4.  GU: Deferred. Musculoskeletal: No clubbing / cyanosis of digits/nails. No joint deformity upper and lower extremities.  Skin: No rashes, lesions, ulcers on limited skin evaluation. No induration; Warm and dry.  Neurologic: CN 2-12 grossly intact with no focal deficits. Sensation  intact in all 4. Romberg sign cerebellar reflexes not assessed.  Psychiatric: Normal judgment and insight. Alert and oriented x 3. Normal mood and appropriate affect.   Data Reviewed: I have personally reviewed following labs and imaging studies  CBC:  Recent Labs Lab 09/16/16 1534 09/17/16 0621 09/18/16 0545 09/19/16 0450  WBC 12.1* 11.0* 10.8* 11.6*  NEUTROABS 7.5  --  6.7 7.4  HGB 12.9 11.6* 11.2* 10.7*  HCT 36.9 33.6* 32.9* 31.9*  MCV 89.8 91.3 92.4 93.8  PLT 315 306 297 307   Basic Metabolic Panel:  Recent Labs Lab 09/16/16 1534 09/17/16 0621 09/18/16 0545 09/19/16 0450  NA 127* 130* 133* 133*  K 2.9* 3.9 4.4 4.1  CL 89* 92* 100* 101  CO2 25 28 28 25   GLUCOSE 199* 155* 168* 149*  BUN 56* 58* 36* 19  CREATININE 1.62* 1.67* 1.02* 0.94  CALCIUM 9.9 9.4 8.7* 8.3*  MG  --  2.0 2.0 1.9  PHOS  --  4.1 2.2* 1.7*   GFR: Estimated Creatinine Clearance: 43 mL/min (by C-G formula based on  SCr of 0.94 mg/dL). Liver Function Tests:  Recent Labs Lab 09/16/16 1534 09/18/16 0545 09/19/16 0450  AST 25 20 16   ALT 16 19 14   ALKPHOS 54 49 46  BILITOT 0.7 0.4 0.3  PROT 8.0 6.6 6.3*  ALBUMIN 3.5 3.0* 2.7*    Recent Labs Lab 09/16/16 1534  LIPASE 31   No results for input(s): AMMONIA in the last 168 hours. Coagulation Profile: No results for input(s): INR, PROTIME in the last 168 hours. Cardiac Enzymes: No results for input(s): CKTOTAL, CKMB, CKMBINDEX, TROPONINI in the last 168 hours. BNP (last 3 results) No results for input(s): PROBNP in the last 8760 hours. HbA1C: No results for input(s): HGBA1C in the last 72 hours. CBG:  Recent Labs Lab 09/18/16 1629 09/18/16 2045 09/19/16 0737 09/19/16 1154 09/19/16 1608  GLUCAP 119* 138* 132* 152* 113*   Lipid Profile: No results for input(s): CHOL, HDL, LDLCALC, TRIG, CHOLHDL, LDLDIRECT in the last 72 hours. Thyroid Function Tests: No results for input(s): TSH, T4TOTAL, FREET4, T3FREE, THYROIDAB in the last 72  hours. Anemia Panel: No results for input(s): VITAMINB12, FOLATE, FERRITIN, TIBC, IRON, RETICCTPCT in the last 72 hours. Sepsis Labs: No results for input(s): PROCALCITON, LATICACIDVEN in the last 168 hours.  No results found for this or any previous visit (from the past 240 hour(s)).   Radiology Studies: US Renal  Result Date: 09/18/2016 CLINICAL DATA:  Acute kidney injury EXAM: RENAL / URINARY TRACT ULTRASOUND COMPLETE COMPARISON:  02/07/2011 CT FINDINGS: Right Kidney: Length: 10.5 cm. Echogenicity within normal limits. No mass or hydronephrosis visualized. Left Kidney: Length: 10.4 cm. Echogenicity within normal limits. No mass or hydronephrosis visualized. Bladder: Physiologic distension of the bladder. No debris or wall thickening noted. Possible hepatic steatosis. IMPRESSION: Normal renal ultrasound. Electronically Signed   By: Marnee Spring M.D.   On: 09/18/2016 13:33   Ct Maxillofacial Wo Contrast  Result Date: 09/18/2016 CLINICAL DATA:  Left-sided face pain. Cough and congestion for 2 weeks EXAM: CT MAXILLOFACIAL WITHOUT CONTRAST TECHNIQUE: Multidetector CT imaging of the maxillofacial structures was performed. Multiplanar CT image reconstructions were also generated. A small metallic BB was placed on the right temple in order to reliably differentiate right from left. COMPARISON:  None. FINDINGS: Osseous: No fracture or mandibular dislocation. No destructive process. Orbits: Negative. No traumatic or inflammatory finding. Sinuses: Mucosal thickening and fluid levels in the left maxillary and bilateral frontal sinuses. Mucosal disease effaces the outflow of the sinuses. Bilateral anterior ethmoid opacification also noted. Mucosal thickening narrows the sphenoid sinus ostia. Right maxillary infundibulum is effaced but there is a patent secondary ostium. A midline nasal septum.  No invasive features. Bilateral partial opacification of mastoid air cells. Left middle ear opacification. Negative  nasopharynx. Soft tissues: Negative. Limited intracranial: No significant or unexpected finding. IMPRESSION: 1. Acute sinusitis with fluid levels in the left maxillary and bifrontal sinuses. 2. Bilateral mastoid opacification. Electronically Signed   By: Marnee Spring M.D.   On: 09/18/2016 13:36   Scheduled Meds: . ALPRAZolam  0.25 mg Oral BID  . amoxicillin-clavulanate  1 tablet Oral Q12H  . aspirin EC  81 mg Oral Daily  . citalopram  20 mg Oral Daily  . dextromethorphan-guaiFENesin  1 tablet Oral BID  . enoxaparin (LOVENOX) injection  40 mg Subcutaneous Q24H  . famotidine  20 mg Oral Daily  . fluticasone  2 spray Each Nare Daily  . insulin aspart  0-15 Units Subcutaneous TID WC  . insulin aspart  0-5 Units Subcutaneous QHS  .  insulin aspart  4 Units Subcutaneous TID WC  . loratadine  10 mg Oral Daily  . niacin  1,000 mg Oral BID  . primidone  50 mg Oral BID  . propranolol  40 mg Oral Daily  . rosuvastatin  10 mg Oral q1800   Continuous Infusions: . sodium chloride 75 mL/hr at 09/19/16 0357    LOS: 3 days   Merlene Laughter, DO [facility] Pager [phone]  If 7PM-7AM, please contact night-coverage [email or web address] Password [email or web address] 09/19/2016, 5:09 PM

## 2016-09-19 NOTE — Evaluation (Signed)
Physical Therapy Evaluation Patient Details Name: Kendra White MRN: [id number] DOB: 02/28/1931 Today's Date: 09/19/2016   History of Present Illness  83 y.o. female with a history of hypertension, anxiety, diabetes, dependent edema on Lasix, hyperlipidemia, history of TIA. Patient presents with complaints of cough, congestion, weakness for the past 2 weeks. Her symptoms started after developing right upper quadrant pain that radiated into her right shoulder 2 weeks ago that was accompanied with nausea and dry heaves (should be noted that while the patient points to right upper quadrant, the patient states "chest pain"). She saw her doctor, who thought that her pain was due to her gallbladder. The patient states that she was going to be referred to general surgery. Shortly after, her pain improved, but then became having more nasal congestion and coughing. She's had little oral intake both liquid and solid. She has been dry heaving. Admitted for Dehydration and AKI and found to have Acute Sinusitis.     Clinical Impression  Pt received in bed, and was agreeable to PT evaluation.  Pt states that she is normally independent with ambulation, ADL's, and driving.  During PT evaluation she required Min guard for sit<>stand and ambulation x1800ft due to being unsteady on her feet.  She utilized the IV pole for UE support during ambulation.  Recommend that she f/u with HHPT upon d/c.     Follow Up Recommendations Home health PT    Equipment Recommendations  None recommended by PT    Recommendations for Other Services       Precautions / Restrictions Precautions Precautions: Fall Precaution Comments: 2 falls - 1 where she fell OOB, and the other where she took a step back and stepped out of her bedroom shoes.   Restrictions Weight Bearing Restrictions: No      Mobility  Bed Mobility Overal bed mobility: Modified Independent                Transfers Overall transfer level: Needs  assistance Equipment used: None Transfers: Sit to/from Stand Sit to Stand: Min guard            Ambulation/Gait Ambulation/Gait assistance: Min assist Ambulation Distance (Feet): 100 Feet Assistive device:  (IV pole) Gait Pattern/deviations: Wide base of support   Gait velocity interpretation: <1.8 ft/sec, indicative of risk for recurrent falls General Gait Details: Pt expressed feeling dizzy/unsteady when ambulating.  Stairs            Wheelchair Mobility    Modified Rankin (Stroke Patients Only)       Balance Overall balance assessment: History of Falls;Needs assistance Sitting-balance support: Bilateral upper extremity supported;Feet supported Sitting balance-Leahy Scale: Good     Standing balance support: Single extremity supported Standing balance-Leahy Scale: Fair                               Pertinent Vitals/Pain Pain Assessment: No/denies pain    Home Living   Living Arrangements: Spouse/significant other Available Help at Discharge: Available 24 hours/day (Dtr lives next door and is a Child psychotherapistsocial worker.  She works from home. ) Type of Home: House Home Access: Stairs to enter   Entergy CorporationEntrance Stairs-Number of Steps: 1 Home Layout: Able to live on main level with bedroom/bathroom;Laundry or work area in Pitney Bowesbasement Home Equipment: Environmental consultantWalker - 2 wheels;Cane - single point;Transport chair;Bedside commode      Prior Function Level of Independence: Independent   Gait / Transfers Assistance Needed: independent, still  driving.   ADL's / Homemaking Assistance Needed: independent        Hand Dominance        Extremity/Trunk Assessment   Upper Extremity Assessment Upper Extremity Assessment: Overall WFL for tasks assessed    Lower Extremity Assessment Lower Extremity Assessment: Generalized weakness       Communication   Communication: HOH  Cognition Arousal/Alertness: Awake/alert Behavior During Therapy: WFL for tasks  assessed/performed Overall Cognitive Status: Within Functional Limits for tasks assessed                      General Comments      Exercises     Assessment/Plan    PT Assessment Patient needs continued PT services  PT Problem List Decreased strength;Decreased activity tolerance;Decreased balance;Decreased mobility       PT Treatment Interventions DME instruction;Gait training;Functional mobility training;Therapeutic activities;Therapeutic exercise;Balance training;Patient/family education    PT Goals (Current goals can be found in the Care Plan section)  Acute Rehab PT Goals Patient Stated Goal: To get stronger and go home.  PT Goal Formulation: With patient/family Time For Goal Achievement: 09/26/16 Potential to Achieve Goals: Fair    Frequency Min 3X/week   Barriers to discharge        Co-evaluation               End of Session Equipment Utilized During Treatment: Gait belt Activity Tolerance: Patient tolerated treatment well Patient left: in bed;with call bell/phone within reach Nurse Communication: Mobility status (Megan, RN notified of pt's mobility status, and mobility sheet left hanging in the room.  ) PT Visit Diagnosis: Other abnormalities of gait and mobility (R26.89);Muscle weakness (generalized) (M62.81)    Functional Assessment Tool Used: AM-PAC 6 Clicks Basic Mobility Functional Limitation: Mobility: Walking and moving around Mobility: Walking and Moving Around Current Status (Z6109): At least 20 percent but less than 40 percent impaired, limited or restricted Mobility: Walking and Moving Around Goal Status [phone]): At least 1 percent but less than 20 percent impaired, limited or restricted    Time: 0981-1914 PT Time Calculation (min) (ACUTE ONLY): 33 min   Charges:   PT Evaluation $PT Eval Low Complexity: 1 Procedure PT Treatments $Gait Training: 8-22 mins   PT G Codes:   PT G-Codes **NOT FOR INPATIENT CLASS** Functional  Assessment Tool Used: AM-PAC 6 Clicks Basic Mobility Functional Limitation: Mobility: Walking and moving around Mobility: Walking and Moving Around Current Status (N8295): At least 20 percent but less than 40 percent impaired, limited or restricted Mobility: Walking and Moving Around Goal Status [phone]): At least 1 percent but less than 20 percent impaired, limited or restricted     Beth Jaclyn Carew, PT, DPT X: [phone]

## 2016-09-20 LAB — COMPREHENSIVE METABOLIC PANEL
ALBUMIN: 2.7 g/dL — AB (ref 3.5–5.0)
ALT: 18 U/L (ref 14–54)
AST: 22 U/L (ref 15–41)
Alkaline Phosphatase: 46 U/L (ref 38–126)
Anion gap: 10 (ref 5–15)
BILIRUBIN TOTAL: 0.6 mg/dL (ref 0.3–1.2)
BUN: 19 mg/dL (ref 6–20)
CHLORIDE: 102 mmol/L (ref 101–111)
CO2: 20 mmol/L — ABNORMAL LOW (ref 22–32)
Calcium: 8.2 mg/dL — ABNORMAL LOW (ref 8.9–10.3)
Creatinine, Ser: 1.09 mg/dL — ABNORMAL HIGH (ref 0.44–1.00)
GFR calc Af Amer: 53 mL/min — ABNORMAL LOW (ref >60)
GFR calc non Af Amer: 46 mL/min — ABNORMAL LOW (ref >60)
GLUCOSE: 179 mg/dL — AB (ref 65–99)
POTASSIUM: 4 mmol/L (ref 3.5–5.1)
Sodium: 132 mmol/L — ABNORMAL LOW (ref 135–145)
TOTAL PROTEIN: 6.3 g/dL — AB (ref 6.5–8.1)

## 2016-09-20 LAB — GLUCOSE, CAPILLARY
GLUCOSE-CAPILLARY: 215 mg/dL — AB (ref 65–99)
Glucose-Capillary: 164 mg/dL — ABNORMAL HIGH (ref 65–99)
Glucose-Capillary: 109 mg/dL — ABNORMAL HIGH (ref 65–99)
Glucose-Capillary: 192 mg/dL — ABNORMAL HIGH (ref 65–99)

## 2016-09-20 LAB — CBC WITH DIFFERENTIAL/PLATELET
BASOS ABS: 0.1 10*3/uL (ref 0.0–0.1)
BASOS PCT: 0 %
Eosinophils Absolute: 0.2 10*3/uL (ref 0.0–0.7)
Eosinophils Relative: 1 %
HEMATOCRIT: 33.2 % — AB (ref 36.0–46.0)
Hemoglobin: 11 g/dL — ABNORMAL LOW (ref 12.0–15.0)
Lymphocytes Relative: 14 %
Lymphs Abs: 2.4 10*3/uL (ref 0.7–4.0)
MCH: 31.1 pg (ref 26.0–34.0)
MCHC: 33.1 g/dL (ref 30.0–36.0)
MCV: 93.8 fL (ref 78.0–100.0)
MONO ABS: 1.1 10*3/uL — AB (ref 0.1–1.0)
Monocytes Relative: 7 %
NEUTROS ABS: 13 10*3/uL — AB (ref 1.7–7.7)
NEUTROS PCT: 78 %
Platelets: 324 10*3/uL (ref 150–400)
RBC: 3.54 MIL/uL — ABNORMAL LOW (ref 3.87–5.11)
RDW: 14 % (ref 11.5–15.5)
WBC: 16.7 10*3/uL — ABNORMAL HIGH (ref 4.0–10.5)

## 2016-09-20 LAB — RESPIRATORY PANEL BY PCR
Adenovirus: NOT DETECTED
Bordetella pertussis: NOT DETECTED
CHLAMYDOPHILA PNEUMONIAE-RVPPCR: NOT DETECTED
CORONAVIRUS OC43-RVPPCR: NOT DETECTED
Coronavirus 229E: NOT DETECTED
Coronavirus HKU1: NOT DETECTED
Coronavirus NL63: NOT DETECTED
INFLUENZA A-RVPPCR: NOT DETECTED
INFLUENZA B-RVPPCR: NOT DETECTED
MYCOPLASMA PNEUMONIAE-RVPPCR: NOT DETECTED
Metapneumovirus: NOT DETECTED
PARAINFLUENZA VIRUS 4-RVPPCR: NOT DETECTED
Parainfluenza Virus 1: NOT DETECTED
Parainfluenza Virus 2: NOT DETECTED
Parainfluenza Virus 3: NOT DETECTED
RESPIRATORY SYNCYTIAL VIRUS-RVPPCR: NOT DETECTED
Rhinovirus / Enterovirus: NOT DETECTED

## 2016-09-20 LAB — MAGNESIUM: Magnesium: 1.7 mg/dL (ref 1.7–2.4)

## 2016-09-20 LAB — PHOSPHORUS: Phosphorus: 1 mg/dL — CL (ref 2.5–4.6)

## 2016-09-20 MED ORDER — AMPICILLIN-SULBACTAM SODIUM 1.5 (1-0.5) G IJ SOLR
1.5000 g | Freq: Three times a day (TID) | INTRAMUSCULAR | Status: DC
  Administered 2016-09-20 – 2016-09-21 (×3): 1.5 g via INTRAVENOUS
  Filled 2016-09-20 (×7): qty 1.5

## 2016-09-20 NOTE — Progress Notes (Signed)
PROGRESS NOTE    Kendra White  HQI:[id number] DOB: 10-31-1930 DOA: 09/16/2016 PCP: Samuel Jester, DO   Brief Narrative:  Kendra White is a 83 y.o. female with a history of hypertension, anxiety, diabetes, dependent edema on Lasix, hyperlipidemia, history of TIA. Patient presents with complaints of cough, congestion, weakness for the past 2 weeks. Her symptoms started after developing right upper quadrant pain that radiated into her right shoulder 2 weeks ago that was accompanied with nausea and dry heaves (should be noted that while the patient points to right upper quadrant, the patient states "chest pain"). She saw her doctor, who thought that her pain was due to her gallbladder. The patient states that she was going to be referred to general surgery. Shortly after, her pain improved, but then became having more nasal congestion and coughing. She's had little oral intake both liquid and solid. She has been dry heaving. Admitted for Dehydration and AKI and found to have Acute Sinusitis. PT consulted and recommends Home Health Pt. Patient felt worse and Labs worsening so Abx Escalated to IV Therapy.  Assessment & Plan:   Principal Problem:   Acute renal insufficiency Active Problems:   Dehydration   Hyponatremia   Hypokalemia   Diabetes mellitus (HCC)   Essential hypertension   Cough   Acute sinusitis   Eye redness  AKI, improved  -C/w NS at 75 mL/hr without KCl -BUN/Cr went from 56/1.62 -> 58/1.67 -> 36/1.02 -> 19/0.94 -> 19/1.09 with IVF Rehydration -Obtained Renal U/S and was a normal Renal ultrasound -Avoid Nephrotoxics and Held Furosemide and HCTZ -Repeat CMP in AM  Dehydration -Likely from poor po Intake, diruetic use and possibly Viral Syndrome/Acute Sinusitis  -C/w Antiemetics with Zofran 4 mg po/IV -C/w NS at 75 mL without Kcl -Hold Home Lasix and HCTZ -Continue to Monitor Volume Status  Acute Sinusitis -Cough likely from postnasal drip; Complained of Mouth Soreness  today (no Erythema noted) -Influenza PCR was Negative; Will obtain Viral Respiratory Panel; Droplet Precautions -CT Maxillofacial w/o Contrast showed Acute Sinusitis with Fluid Levels in the Left Maxillary and BiFrontal Sinuses with Bilateral Mastoid Opacification. -Started Patient on Augmentin 875-125 mg po BID however Labs worsening and patient still not feeling well so Escalated to IV Unasyn -Added Flonase 2 spray in Each Nare po Daily -C/w Chloraseptic Spray -C/w Supportive Care with Acetaminophen 650 mg po q6hprn for Mild Pain or Fever >101 -Continue to Monitor as CXR showed no Active Cardiopulmonary Disease  Leukocytosis likely 2/2 to Acute Sinusitis -WBC went from 12.1 -> 11.0 -> 10.8 -> 11.6 -> 16.7 -CT Maxillofacial without Contrast showed Acute Sinusitis with Fluid levels in the Left Maxillary and Bifrontal Sinuses and Bilateral Mastoid Opacification -Started patient on Augmentin 875-125 mg po BID however Labs worsening and patient still not feeling well so Escalated to IV Unasyn -Viral Respiratory Panel Negative; D/C Droplet Precautions -Started patient on Flonase 2 spray each Nare po Daily -Repeat CBC in AM  Hyponatremia, stable -Sodium went from 127->130 -> 133 ->132; Improved with IVF Rehydration -Likely 2/2 to Dehydration and Diuretic Use -D/C'd HCTZ; Furosemide not ordered on Admission -Repeat CMP in AM  Hypokalemia -Improved. K+ now 4.0 -Repeat CMP in AM  Diabetes Mellitus Type 2 -Hold Metformin 500 mg po Dialy -C/w Moderate Novolog SSI AC HS -CBG's ranging from 109-215  Hypertension -C/w Propanolol 20 mg po BID -Hold HCTZ 50 mg po Daily and Furosemide 40 mg po Daily  Depression and Anxiety -C/w Alprazolam 0.125-0.25 mg po TIDprn  and with Citalopram 40 mg po Daily  Sciatica -Recently received Steroid Injection  Eye Redness -Held Patient's Restasis and started Visine  DVT prophylaxis: Lovenox 40 mg sq Code Status: DO NOT RESUSCITATE Family Communication:  No family present at bedside Disposition Plan:  Home Health PT at D/C when medically stable  Consultants:  None   Procedures: None  Antimicrobials: Anti-infectives    Start     Dose/Rate Route Frequency Ordered Stop   09/20/16 1000  ampicillin-sulbactam (UNASYN) 1.5 g in sodium chloride 0.9 % 50 mL IVPB     1.5 g 100 mL/hr over 30 Minutes Intravenous Every 8 hours 09/20/16 0827     09/18/16 1515  amoxicillin-clavulanate (AUGMENTIN) 875-125 MG per tablet 1 tablet  Status:  Discontinued     1 tablet Oral Every 12 hours 09/18/16 1503 09/20/16 0803     Subjective: Seen and examined and stated she still felt bad and did not feel any better. Stated mouth was still a little sore and had pressure in sinuses. No Nausea or Vomiting but had a little cough. No other concerns or complaints.    Objective: Vitals:   09/19/16 1447 09/19/16 2106 09/20/16 0457 09/20/16 1411  BP: (!) 123/47 (!) 125/51 (!) 145/59 (!) 115/53  Pulse: 76 72 (!) 106 74  Resp: 18 18 18 20   Temp: 99 F (37.2 C) 99.3 F (37.4 C) 99.9 F (37.7 C) 98.6 F (37 C)  TempSrc:  Oral Oral Oral  SpO2: 97% 96% 93% 96%  Weight:      Height:        Intake/Output Summary (Last 24 hours) at 09/20/16 1812 Last data filed at 09/20/16 1526  Gross per 24 hour  Intake           3022.5 ml  Output                0 ml  Net           3022.5 ml   Filed Weights   09/16/16 1408  Weight: 71.7 kg (158 lb)   Examination: Physical Exam:  Constitutional: NAD and appears mildly uncomfortable.  Eyes: Lids normal and conjunctivae erythematous, sclerae anicteric  ENMT: External Ears, Nose appear normal. Grossly normal hearing. Pain on facial palpation over maxillary sinuses. Mouth without erythema and mucous membranes moist.  Neck: Appears normal, supple, no cervical masses, normal ROM, no appreciable thyromegaly Respiratory: Diminished to auscultation bilaterally, no wheezing, rales, rhonchi or crackles. Normal respiratory effort and  patient is not tachypenic. No accessory muscle use.  Cardiovascular: RRR, no murmurs / rubs / gallops. S1 and S2 auscultated. Mild extremity edema Abdomen: Soft, non-tender, non-distended. No masses palpated. No appreciable hepatosplenomegaly. Bowel sounds positive x4.  GU: Deferred. Musculoskeletal: No clubbing / cyanosis of digits/nails. No joint deformity upper and lower extremities.  Skin: No rashes, lesions, ulcers on limited skin evaluation. No induration; Warm and dry.  Neurologic: CN 2-12 grossly intact with no focal deficits. Sensation intact in all 4. Romberg sign cerebellar reflexes not assessed.  Psychiatric: Normal judgment and insight. Alert and oriented x 3. Normal mood and appropriate affect.   Data Reviewed: I have personally reviewed following labs and imaging studies  CBC:  Recent Labs Lab 09/16/16 1534 09/17/16 0621 09/18/16 0545 09/19/16 0450 09/20/16 0539  WBC 12.1* 11.0* 10.8* 11.6* 16.7*  NEUTROABS 7.5  --  6.7 7.4 13.0*  HGB 12.9 11.6* 11.2* 10.7* 11.0*  HCT 36.9 33.6* 32.9* 31.9* 33.2*  MCV 89.8 91.3 92.4 93.8 93.8  PLT 315 306 297 307 324   Basic Metabolic Panel:  Recent Labs Lab 09/16/16 1534 09/17/16 0621 09/18/16 0545 09/19/16 0450 09/20/16 0539  NA 127* 130* 133* 133* 132*  K 2.9* 3.9 4.4 4.1 4.0  CL 89* 92* 100* 101 102  CO2 25 28 28 25  20*  GLUCOSE 199* 155* 168* 149* 179*  BUN 56* 58* 36* 19 19  CREATININE 1.62* 1.67* 1.02* 0.94 1.09*  CALCIUM 9.9 9.4 8.7* 8.3* 8.2*  MG  --  2.0 2.0 1.9 1.7  PHOS  --  4.1 2.2* 1.7* 1.0*   GFR: Estimated Creatinine Clearance: 37.1 mL/min (by C-G formula based on SCr of 1.09 mg/dL (H)). Liver Function Tests:  Recent Labs Lab 09/16/16 1534 09/18/16 0545 09/19/16 0450 09/20/16 0539  AST 25 20 16 22   ALT 16 19 14 18   ALKPHOS 54 49 46 46  BILITOT 0.7 0.4 0.3 0.6  PROT 8.0 6.6 6.3* 6.3*  ALBUMIN 3.5 3.0* 2.7* 2.7*    Recent Labs Lab 09/16/16 1534  LIPASE 31   No results for input(s):  AMMONIA in the last 168 hours. Coagulation Profile: No results for input(s): INR, PROTIME in the last 168 hours. Cardiac Enzymes: No results for input(s): CKTOTAL, CKMB, CKMBINDEX, TROPONINI in the last 168 hours. BNP (last 3 results) No results for input(s): PROBNP in the last 8760 hours. HbA1C: No results for input(s): HGBA1C in the last 72 hours. CBG:  Recent Labs Lab 09/19/16 1608 09/19/16 2108 09/20/16 0729 09/20/16 1136 09/20/16 1635  GLUCAP 113* 156* 164* 215* 109*   Lipid Profile: No results for input(s): CHOL, HDL, LDLCALC, TRIG, CHOLHDL, LDLDIRECT in the last 72 hours. Thyroid Function Tests: No results for input(s): TSH, T4TOTAL, FREET4, T3FREE, THYROIDAB in the last 72 hours. Anemia Panel: No results for input(s): VITAMINB12, FOLATE, FERRITIN, TIBC, IRON, RETICCTPCT in the last 72 hours. Sepsis Labs: No results for input(s): PROCALCITON, LATICACIDVEN in the last 168 hours.  Recent Results (from the past 240 hour(s))  Respiratory Panel by PCR     Status: None   Collection Time: 09/18/16  8:51 AM  Result Value Ref Range Status   Adenovirus NOT DETECTED NOT DETECTED Final   Coronavirus 229E NOT DETECTED NOT DETECTED Final   Coronavirus HKU1 NOT DETECTED NOT DETECTED Final   Coronavirus NL63 NOT DETECTED NOT DETECTED Final   Coronavirus OC43 NOT DETECTED NOT DETECTED Final   Metapneumovirus NOT DETECTED NOT DETECTED Final   Rhinovirus / Enterovirus NOT DETECTED NOT DETECTED Final   Influenza A NOT DETECTED NOT DETECTED Final   Influenza B NOT DETECTED NOT DETECTED Final   Parainfluenza Virus 1 NOT DETECTED NOT DETECTED Final   Parainfluenza Virus 2 NOT DETECTED NOT DETECTED Final   Parainfluenza Virus 3 NOT DETECTED NOT DETECTED Final   Parainfluenza Virus 4 NOT DETECTED NOT DETECTED Final   Respiratory Syncytial Virus NOT DETECTED NOT DETECTED Final   Bordetella pertussis NOT DETECTED NOT DETECTED Final   Chlamydophila pneumoniae NOT DETECTED NOT DETECTED  Final   Mycoplasma pneumoniae NOT DETECTED NOT DETECTED Final    Comment: Performed at [facility], [address], [place], [place] [address]    Radiology Studies: No results found. Scheduled Meds: . ALPRAZolam  0.25 mg Oral BID  . ampicillin-sulbactam (UNASYN) IV  1.5 g Intravenous Q8H  . aspirin EC  81 mg Oral Daily  . citalopram  20 mg Oral Daily  . dextromethorphan-guaiFENesin  1 tablet Oral BID  . enoxaparin (LOVENOX) injection  40  mg Subcutaneous Q24H  . famotidine  20 mg Oral Daily  . fluticasone  2 spray Each Nare Daily  . insulin aspart  0-15 Units Subcutaneous TID WC  . insulin aspart  0-5 Units Subcutaneous QHS  . insulin aspart  4 Units Subcutaneous TID WC  . loratadine  10 mg Oral Daily  . niacin  1,000 mg Oral BID  . primidone  50 mg Oral BID  . propranolol  40 mg Oral Daily  . rosuvastatin  10 mg Oral q1800   Continuous Infusions: . sodium chloride 75 mL/hr at 09/20/16 0650    LOS: 4 days   Merlene Laughtermair Latif Selyna Klahn, DO [facility] Pager [phone]  If 7PM-7AM, please contact night-coverage [email or web address] Password [email or web address] 09/20/2016, 6:12 PM

## 2016-09-20 NOTE — Progress Notes (Signed)
Pharmacy Antibiotic Note  Kendra White is a 83 y.o. female admitted on 09/16/2016 with cough.  Pharmacy has been consulted for Unasyn dosing for Acute Sinusitis.  Previous Augmentin therapy.  Plan: Unasyn 1.5gm IV every 8 hours. Monitor labs, micro and vitals.   Height: 5\' 3"  (160 cm) Weight: 158 lb (71.7 kg) IBW/kg (Calculated) : 52.4  Temp (24hrs), Avg:99.4 F (37.4 C), Min:99 F (37.2 C), Max:99.9 F (37.7 C)   Recent Labs Lab 09/16/16 1534 09/17/16 0621 09/18/16 0545 09/19/16 0450 09/20/16 0539  WBC 12.1* 11.0* 10.8* 11.6* 16.7*  CREATININE 1.62* 1.67* 1.02* 0.94 1.09*    Estimated Creatinine Clearance: 37.1 mL/min (by C-G formula based on SCr of 1.09 mg/dL (H)).    Allergies  Allergen Reactions  . Diphenhydramine Hcl Rash    Antimicrobials this admission: Augmentin 3/4 >> 3/6 Unasyn 3/4 >>   Dose adjustments this admission: n/a   Microbiology results:   Thank you for allowing pharmacy to be a part of this patient's care.  Kendra White, Kendra White 09/20/2016 8:28 AM

## 2016-09-21 DIAGNOSIS — N289 Disorder of kidney and ureter, unspecified: Secondary | ICD-10-CM

## 2016-09-21 DIAGNOSIS — H578 Other specified disorders of eye and adnexa: Secondary | ICD-10-CM

## 2016-09-21 DIAGNOSIS — E860 Dehydration: Secondary | ICD-10-CM

## 2016-09-21 DIAGNOSIS — R05 Cough: Secondary | ICD-10-CM

## 2016-09-21 DIAGNOSIS — E871 Hypo-osmolality and hyponatremia: Secondary | ICD-10-CM

## 2016-09-21 DIAGNOSIS — J0140 Acute pansinusitis, unspecified: Secondary | ICD-10-CM

## 2016-09-21 DIAGNOSIS — E119 Type 2 diabetes mellitus without complications: Secondary | ICD-10-CM

## 2016-09-21 DIAGNOSIS — I10 Essential (primary) hypertension: Secondary | ICD-10-CM

## 2016-09-21 LAB — CBC WITH DIFFERENTIAL/PLATELET
Basophils Absolute: 0.1 10*3/uL (ref 0.0–0.1)
Basophils Relative: 1 %
EOS PCT: 2 %
Eosinophils Absolute: 0.3 10*3/uL (ref 0.0–0.7)
HCT: 31 % — ABNORMAL LOW (ref 36.0–46.0)
Hemoglobin: 10.4 g/dL — ABNORMAL LOW (ref 12.0–15.0)
LYMPHS ABS: 2.5 10*3/uL (ref 0.7–4.0)
Lymphocytes Relative: 20 %
MCH: 31.2 pg (ref 26.0–34.0)
MCHC: 33.5 g/dL (ref 30.0–36.0)
MCV: 93.1 fL (ref 78.0–100.0)
MONO ABS: 1 10*3/uL (ref 0.1–1.0)
MONOS PCT: 8 %
Neutro Abs: 8.6 10*3/uL — ABNORMAL HIGH (ref 1.7–7.7)
Neutrophils Relative %: 69 %
PLATELETS: 327 10*3/uL (ref 150–400)
RBC: 3.33 MIL/uL — ABNORMAL LOW (ref 3.87–5.11)
RDW: 14.5 % (ref 11.5–15.5)
WBC: 12.4 10*3/uL — ABNORMAL HIGH (ref 4.0–10.5)

## 2016-09-21 LAB — GLUCOSE, CAPILLARY
GLUCOSE-CAPILLARY: 181 mg/dL — AB (ref 65–99)
Glucose-Capillary: 141 mg/dL — ABNORMAL HIGH (ref 65–99)

## 2016-09-21 LAB — COMPREHENSIVE METABOLIC PANEL
ALT: 27 U/L (ref 14–54)
AST: 33 U/L (ref 15–41)
Albumin: 2.6 g/dL — ABNORMAL LOW (ref 3.5–5.0)
Alkaline Phosphatase: 47 U/L (ref 38–126)
Anion gap: 8 (ref 5–15)
BUN: 14 mg/dL (ref 6–20)
CHLORIDE: 102 mmol/L (ref 101–111)
CO2: 23 mmol/L (ref 22–32)
Calcium: 8 mg/dL — ABNORMAL LOW (ref 8.9–10.3)
Creatinine, Ser: 0.92 mg/dL (ref 0.44–1.00)
GFR, EST NON AFRICAN AMERICAN: 56 mL/min — AB (ref >60)
Glucose, Bld: 154 mg/dL — ABNORMAL HIGH (ref 65–99)
POTASSIUM: 3.5 mmol/L (ref 3.5–5.1)
Sodium: 133 mmol/L — ABNORMAL LOW (ref 135–145)
TOTAL PROTEIN: 6.5 g/dL (ref 6.5–8.1)
Total Bilirubin: 0.3 mg/dL (ref 0.3–1.2)

## 2016-09-21 LAB — MAGNESIUM: MAGNESIUM: 1.7 mg/dL (ref 1.7–2.4)

## 2016-09-21 LAB — PHOSPHORUS: PHOSPHORUS: 1.7 mg/dL — AB (ref 2.5–4.6)

## 2016-09-21 MED ORDER — FUROSEMIDE 40 MG PO TABS: 20.0000 mg | ORAL_TABLET | Freq: Every morning | ORAL | 0 refills | Status: DC

## 2016-09-21 MED ORDER — CITALOPRAM HYDROBROMIDE 20 MG PO TABS: 20.0000 mg | ORAL_TABLET | Freq: Every day | ORAL | Status: DC

## 2016-09-21 MED ORDER — FLUTICASONE PROPIONATE 50 MCG/ACT NA SUSP: 2.0000 | Freq: Every day | NASAL | 0 refills | Status: DC

## 2016-09-21 MED ORDER — AMOXICILLIN-POT CLAVULANATE 875-125 MG PO TABS
1.0000 | ORAL_TABLET | Freq: Two times a day (BID) | ORAL | Status: DC
  Administered 2016-09-21: 1 via ORAL
  Filled 2016-09-21: qty 1

## 2016-09-21 MED ORDER — ROSUVASTATIN CALCIUM 10 MG PO TABS
10.0000 mg | ORAL_TABLET | Freq: Every day | ORAL | 0 refills | Status: DC
Start: 2016-09-21 — End: 2023-01-01

## 2016-09-21 MED ORDER — DM-GUAIFENESIN ER 30-600 MG PO TB12: 1.0000 | ORAL_TABLET | Freq: Two times a day (BID) | ORAL | 0 refills | Status: AC

## 2016-09-21 MED ORDER — AMOXICILLIN-POT CLAVULANATE 875-125 MG PO TABS: 1.0000 | ORAL_TABLET | Freq: Two times a day (BID) | ORAL | 0 refills | Status: AC

## 2016-09-21 MED ORDER — LORATADINE 10 MG PO TABS: 10.0000 mg | ORAL_TABLET | Freq: Every day | ORAL | 0 refills | Status: DC

## 2016-09-21 NOTE — Discharge Summary (Addendum)
Physician Discharge Summary  ARIES KASA AYT:[id number] DOB: Aug 17, 1930 DOA: 09/16/2016  PCP: Samuel Jester, DO  Admit date: 09/16/2016 Discharge date: 09/21/2016  Admitted From: Home Disposition:   Home   Recommendations for Outpatient Follow-up:  1. Follow up with PCP in 1-2 weeks 2. Take prescriptions as prescribed 3. Activity only as tolerated 4. Home health is set up for you 5. Please obtain BMP/CBC in one week 6.  Home Health:Yes- PT/ RN/ Aide Equipment/Devices: None  Discharge Condition: Stable CODE STATUS: DNR Diet recommendation: Heart Healthy / Carb Modified  Brief/Interim Summary: Kendra Kroeger Pruittis a 83 y.o.femalewith a history of hypertension, anxiety, diabetes, dependent edema on Lasix, hyperlipidemia, history of TIA. Patient presents with complaints of cough, congestion, weakness for the past 2 weeks. Her symptoms started after developing right upper quadrant pain that radiated into her right shoulder 2 weeks ago that was accompanied with nausea and dry heaves (should be noted that while the patient points to right upper quadrant, the patient states "chest pain"). She saw her doctor, who thought that her pain was due to her gallbladder. The patient states that she was going to be referred to general surgery. Shortly after, her pain improved, but then became having more nasal congestion and coughing. She's had little oral intake both liquid and solid. She has been dry heaving. Admitted for Dehydration and AKI and found to have Acute Sinusitis. PT consulted and recommends Home Health Pt. Patient felt worse and Labs worsening so Abx Escalated to IV Therapy.  Patient improved in terms of her kidney function and was tolerating diet on day of discharge.  She felt as though some of her symptoms improved and she felt stable for discharge on 09/21/16.  Prior to discharge home Case Management had set up [facility] PT/RN and Aide for her.  Discharge Diagnoses:  Principal Problem:   Acute renal  insufficiency Active Problems:   Dehydration   Hyponatremia   Hypokalemia   Diabetes mellitus (HCC)   Essential hypertension   Cough   Acute sinusitis   Eye redness    Discharge Instructions  Discharge Instructions    Call MD for:  difficulty breathing, headache or visual disturbances    Complete by:  As directed    Call MD for:  extreme fatigue    Complete by:  As directed    Call MD for:  hives    Complete by:  As directed    Call MD for:  persistant dizziness or light-headedness    Complete by:  As directed    Call MD for:  persistant nausea and vomiting    Complete by:  As directed    Call MD for:  severe uncontrolled pain    Complete by:  As directed    Call MD for:  temperature >100.4    Complete by:  As directed    Diet - low sodium heart healthy    Complete by:  As directed    Discharge instructions    Complete by:  As directed    Home health set up for you Please take prescriptions as prescribed Schedule follow up with your PCP within 1 week Get BMP done at that time   Increase activity slowly    Complete by:  As directed      Allergies as of 09/21/2016      Reactions   Diphenhydramine Hcl Rash      Medication List    STOP taking these medications   hydrochlorothiazide 50 MG tablet  Commonly known as:  HYDRODIURIL     TAKE these medications   acetaminophen 325 MG tablet Commonly known as:  TYLENOL Take 325-650 mg by mouth daily as needed for mild pain or moderate pain. For pain   ALPRAZolam 0.25 MG tablet Commonly known as:  XANAX Take 0.125-0.25 mg by mouth 3 (three) times daily as needed for anxiety.   amoxicillin-clavulanate 875-125 MG tablet Commonly known as:  AUGMENTIN Take 1 tablet by mouth every 12 (twelve) hours.   aspirin 81 MG EC tablet Take 81 mg by mouth daily.   Biotin 5000 MCG Caps Take by mouth daily.   citalopram 20 MG tablet Commonly known as:  CELEXA Take 1 tablet (20 mg total) by mouth daily. What changed:  when to  take this   dextromethorphan-guaiFENesin 30-600 MG 12hr tablet Commonly known as:  MUCINEX DM Take 1 tablet by mouth 2 (two) times daily.   fish oil-omega-3 fatty acids 1000 MG capsule Take 1 g by mouth 2 (two) times daily.   fluticasone 50 MCG/ACT nasal spray Commonly known as:  FLONASE Place 2 sprays into both nostrils daily. Start taking on:  09/22/2016   furosemide 40 MG tablet Commonly known as:  LASIX Take 0.5 tablets (20 mg total) by mouth every morning. What changed:  how much to take   Garlic 1000 MG Caps Take 1,000 mg by mouth daily.   loratadine 10 MG tablet Commonly known as:  CLARITIN Take 1 tablet (10 mg total) by mouth daily. Start taking on:  09/22/2016   metFORMIN 500 MG tablet Commonly known as:  GLUCOPHAGE Take 500 mg by mouth at bedtime.   multivitamin with minerals tablet Take 1 tablet by mouth daily.   primidone 50 MG tablet Commonly known as:  MYSOLINE Take 50 mg by mouth 2 (two) times daily.   propranolol 40 MG tablet Commonly known as:  INDERAL Take 40 mg by mouth daily.   RESTASIS MULTIDOSE 0.05 % ophthalmic emulsion Generic drug:  cycloSPORINE Place 1 drop into both eyes 2 (two) times daily.   rosuvastatin 10 MG tablet Commonly known as:  CRESTOR Take 1 tablet (10 mg total) by mouth daily at 6 PM. What changed:  medication strength  how much to take  when to take this      Follow-up Information    CYNTHIA BUTLER, DO. Schedule an appointment as soon as possible for a visit in 1 week(s).   Why:  Get BMP at that visit Contact information: [address] [place] [address] [phone]          Allergies  Allergen Reactions  . Diphenhydramine Hcl Rash    Consultations:  PT  CM   Procedures/Studies: Dg Chest 2 View  Result Date: 09/16/2016 CLINICAL DATA:  Cough and fever EXAM: CHEST  2 VIEW COMPARISON:  02/07/2011 FINDINGS: Heart size within normal limits. Negative for heart failure. Atherosclerotic  calcification in the aortic arch. Lungs are clear without infiltrate effusion or mass. No acute skeletal abnormality. IMPRESSION: No active cardiopulmonary disease. Electronically Signed   By: Marlan Palau M.D.   On: 09/16/2016 15:17   US Renal  Result Date: 09/18/2016 CLINICAL DATA:  Acute kidney injury EXAM: RENAL / URINARY TRACT ULTRASOUND COMPLETE COMPARISON:  02/07/2011 CT FINDINGS: Right Kidney: Length: 10.5 cm. Echogenicity within normal limits. No mass or hydronephrosis visualized. Left Kidney: Length: 10.4 cm. Echogenicity within normal limits. No mass or hydronephrosis visualized. Bladder: Physiologic distension of the bladder. No debris or wall thickening  noted. Possible hepatic steatosis. IMPRESSION: Normal renal ultrasound. Electronically Signed   By: Marnee Spring M.D.   On: 09/18/2016 13:33   Ct Maxillofacial Wo Contrast  Result Date: 09/18/2016 CLINICAL DATA:  Left-sided face pain. Cough and congestion for 2 weeks EXAM: CT MAXILLOFACIAL WITHOUT CONTRAST TECHNIQUE: Multidetector CT imaging of the maxillofacial structures was performed. Multiplanar CT image reconstructions were also generated. A small metallic BB was placed on the right temple in order to reliably differentiate right from left. COMPARISON:  None. FINDINGS: Osseous: No fracture or mandibular dislocation. No destructive process. Orbits: Negative. No traumatic or inflammatory finding. Sinuses: Mucosal thickening and fluid levels in the left maxillary and bilateral frontal sinuses. Mucosal disease effaces the outflow of the sinuses. Bilateral anterior ethmoid opacification also noted. Mucosal thickening narrows the sphenoid sinus ostia. Right maxillary infundibulum is effaced but there is a patent secondary ostium. A midline nasal septum.  No invasive features. Bilateral partial opacification of mastoid air cells. Left middle ear opacification. Negative nasopharynx. Soft tissues: Negative. Limited intracranial: No significant or  unexpected finding. IMPRESSION: 1. Acute sinusitis with fluid levels in the left maxillary and bifrontal sinuses. 2. Bilateral mastoid opacification. Electronically Signed   By: Marnee Spring M.D.   On: 09/18/2016 13:36     Subjective: Patient says she feels about the same today as she did yesterday.  Mentions she would like to discharge as she feels she will get stronger at home.  Denies any new symptoms.  No overnight events noted.  Discharge Exam: Vitals:   09/21/16 0500 09/21/16 1400  BP: (!) 142/55 (!) 130/50  Pulse: 87 80  Resp: 20 20  Temp: 99.7 F (37.6 C) 98.4 F (36.9 C)   Vitals:   09/20/16 1411 09/20/16 2100 09/21/16 0500 09/21/16 1400  BP: (!) 115/53 (!) 121/47 (!) 142/55 (!) 130/50  Pulse: 74 73 87 80  Resp: 20 20 20 20   Temp: 98.6 F (37 C) 98.1 F (36.7 C) 99.7 F (37.6 C) 98.4 F (36.9 C)  TempSrc: Oral Oral Oral Oral  SpO2: 96% 94% 93% 96%  Weight:      Height:        General: Pt is alert, awake, not in acute distress Cardiovascular: RRR, S1/S2 +, no rubs, no gallops Respiratory: CTA bilaterally, no wheezing, no rhonchi Abdominal: Soft, NT, ND, bowel sounds + Extremities: no edema, no cyanosis    The results of significant diagnostics from this hospitalization (including imaging, microbiology, ancillary and laboratory) are listed below for reference.     Microbiology: Recent Results (from the past 240 hour(s))  Respiratory Panel by PCR     Status: None   Collection Time: 09/18/16  8:51 AM  Result Value Ref Range Status   Adenovirus NOT DETECTED NOT DETECTED Final   Coronavirus 229E NOT DETECTED NOT DETECTED Final   Coronavirus HKU1 NOT DETECTED NOT DETECTED Final   Coronavirus NL63 NOT DETECTED NOT DETECTED Final   Coronavirus OC43 NOT DETECTED NOT DETECTED Final   Metapneumovirus NOT DETECTED NOT DETECTED Final   Rhinovirus / Enterovirus NOT DETECTED NOT DETECTED Final   Influenza A NOT DETECTED NOT DETECTED Final   Influenza B NOT  DETECTED NOT DETECTED Final   Parainfluenza Virus 1 NOT DETECTED NOT DETECTED Final   Parainfluenza Virus 2 NOT DETECTED NOT DETECTED Final   Parainfluenza Virus 3 NOT DETECTED NOT DETECTED Final   Parainfluenza Virus 4 NOT DETECTED NOT DETECTED Final   Respiratory Syncytial Virus NOT DETECTED NOT DETECTED Final  Bordetella pertussis NOT DETECTED NOT DETECTED Final   Chlamydophila pneumoniae NOT DETECTED NOT DETECTED Final   Mycoplasma pneumoniae NOT DETECTED NOT DETECTED Final    Comment: Performed at [facility], [address] [place], [place] [address]     Labs: BNP (last 3 results) No results for input(s): BNP in the last 8760 hours. Basic Metabolic Panel:  Recent Labs Lab 09/17/16 0621 09/18/16 0545 09/19/16 0450 09/20/16 0539 09/21/16 0600  NA 130* 133* 133* 132* 133*  K 3.9 4.4 4.1 4.0 3.5  CL 92* 100* 101 102 102  CO2 28 28 25  20* 23  GLUCOSE 155* 168* 149* 179* 154*  BUN 58* 36* 19 19 14   CREATININE 1.67* 1.02* 0.94 1.09* 0.92  CALCIUM 9.4 8.7* 8.3* 8.2* 8.0*  MG 2.0 2.0 1.9 1.7 1.7  PHOS 4.1 2.2* 1.7* 1.0* 1.7*   Liver Function Tests:  Recent Labs Lab 09/16/16 1534 09/18/16 0545 09/19/16 0450 09/20/16 0539 09/21/16 0600  AST 25 20 16 22  33  ALT 16 19 14 18 27   ALKPHOS 54 49 46 46 47  BILITOT 0.7 0.4 0.3 0.6 0.3  PROT 8.0 6.6 6.3* 6.3* 6.5  ALBUMIN 3.5 3.0* 2.7* 2.7* 2.6*    Recent Labs Lab 09/16/16 1534  LIPASE 31   No results for input(s): AMMONIA in the last 168 hours. CBC:  Recent Labs Lab 09/16/16 1534 09/17/16 0621 09/18/16 0545 09/19/16 0450 09/20/16 0539 09/21/16 0600  WBC 12.1* 11.0* 10.8* 11.6* 16.7* 12.4*  NEUTROABS 7.5  --  6.7 7.4 13.0* 8.6*  HGB 12.9 11.6* 11.2* 10.7* 11.0* 10.4*  HCT 36.9 33.6* 32.9* 31.9* 33.2* 31.0*  MCV 89.8 91.3 92.4 93.8 93.8 93.1  PLT 315 306 297 307 324 327   Cardiac Enzymes: No results for input(s): CKTOTAL, CKMB, CKMBINDEX, TROPONINI in the last 168 hours. BNP: Invalid input(s):  POCBNP CBG:  Recent Labs Lab 09/20/16 1136 09/20/16 1635 09/20/16 2120 09/21/16 0733 09/21/16 1109  GLUCAP 215* 109* 192* 141* 181*   D-Dimer No results for input(s): DDIMER in the last 72 hours. Hgb A1c No results for input(s): HGBA1C in the last 72 hours. Lipid Profile No results for input(s): CHOL, HDL, LDLCALC, TRIG, CHOLHDL, LDLDIRECT in the last 72 hours. Thyroid function studies No results for input(s): TSH, T4TOTAL, T3FREE, THYROIDAB in the last 72 hours.  Invalid input(s): FREET3 Anemia work up No results for input(s): VITAMINB12, FOLATE, FERRITIN, TIBC, IRON, RETICCTPCT in the last 72 hours. Urinalysis    Component Value Date/Time   COLORURINE YELLOW 09/16/2016 1623   APPEARANCEUR HAZY (A) 09/16/2016 1623   LABSPEC 1.014 09/16/2016 1623   PHURINE 5.0 09/16/2016 1623   GLUCOSEU NEGATIVE 09/16/2016 1623   HGBUR NEGATIVE 09/16/2016 1623   BILIRUBINUR NEGATIVE 09/16/2016 1623   KETONESUR NEGATIVE 09/16/2016 1623   PROTEINUR NEGATIVE 09/16/2016 1623   UROBILINOGEN 1.0 02/07/2011 1207   NITRITE NEGATIVE 09/16/2016 1623   LEUKOCYTESUR NEGATIVE 09/16/2016 1623   Sepsis Labs Invalid input(s): PROCALCITONIN,  WBC,  LACTICIDVEN Microbiology Recent Results (from the past 240 hour(s))  Respiratory Panel by PCR     Status: None   Collection Time: 09/18/16  8:51 AM  Result Value Ref Range Status   Adenovirus NOT DETECTED NOT DETECTED Final   Coronavirus 229E NOT DETECTED NOT DETECTED Final   Coronavirus HKU1 NOT DETECTED NOT DETECTED Final   Coronavirus NL63 NOT DETECTED NOT DETECTED Final   Coronavirus OC43 NOT DETECTED NOT DETECTED Final   Metapneumovirus NOT DETECTED NOT DETECTED Final  Rhinovirus / Enterovirus NOT DETECTED NOT DETECTED Final   Influenza A NOT DETECTED NOT DETECTED Final   Influenza B NOT DETECTED NOT DETECTED Final   Parainfluenza Virus 1 NOT DETECTED NOT DETECTED Final   Parainfluenza Virus 2 NOT DETECTED NOT DETECTED Final   Parainfluenza  Virus 3 NOT DETECTED NOT DETECTED Final   Parainfluenza Virus 4 NOT DETECTED NOT DETECTED Final   Respiratory Syncytial Virus NOT DETECTED NOT DETECTED Final   Bordetella pertussis NOT DETECTED NOT DETECTED Final   Chlamydophila pneumoniae NOT DETECTED NOT DETECTED Final   Mycoplasma pneumoniae NOT DETECTED NOT DETECTED Final    Comment: Performed at [facility], [address] [place], [place] [id number]     Time coordinating discharge: 35 minutes  SIGNED:   Katrinka BlazingAlex U Kadolph, MD  [facility] 09/21/2016, 3:02 PM Pager [phone] If 7PM-7AM, please contact night-coverage [email or web address] Password [email or web address]

## 2016-09-21 NOTE — Progress Notes (Signed)
Physical Therapy Treatment Patient Details Name: Kendra RidgesMarie M White MRN: [id number] DOB: 04/16/1931 Today's Date: 09/21/2016    History of Present Illness 83 y.o. female with a history of hypertension, anxiety, diabetes, dependent edema on Lasix, hyperlipidemia, history of TIA. Patient presents with complaints of cough, congestion, weakness for the past 2 weeks. Her symptoms started after developing right upper quadrant pain that radiated into her right shoulder 2 weeks ago that was accompanied with nausea and dry heaves (should be noted that while the patient points to right upper quadrant, the patient states "chest pain"). She saw her doctor, who thought that her pain was due to her gallbladder. The patient states that she was going to be referred to general surgery. Shortly after, her pain improved, but then became having more nasal congestion and coughing. She's had little oral intake both liquid and solid. She has been dry heaving. Admitted for Dehydration and AKI and found to have Acute Sinusitis.     PT Comments    Pt received in bed, and was agreeable to PT tx.  Pt expressed concerns about her husband who has dementia, and dtr is currently caring for while she is in the hospital.  Pt was able to increase gait distance to 11815ft with RW at Mod (I) level.  Continue to recommend HHPT at this time.     Follow Up Recommendations  Home health PT     Equipment Recommendations  None recommended by PT    Recommendations for Other Services       Precautions / Restrictions Precautions Precautions: Fall Precaution Comments: 2 falls - 1 where she fell OOB, and the other where she took a step back and stepped out of her bedroom shoes.   Restrictions Weight Bearing Restrictions: No    Mobility  Bed Mobility Overal bed mobility: Modified Independent                Transfers Overall transfer level: Modified independent Equipment used: Rolling walker (2 wheeled) Transfers: Sit to/from  Stand Sit to Stand: Modified independent (Device/Increase time)         General transfer comment: pt ambulated into the bathroom and was independent with hygiene, but required some assistance to don new brief.    Ambulation/Gait Ambulation/Gait assistance: Modified independent (Device/Increase time) Ambulation Distance (Feet): 115 Feet Assistive device: Rolling walker (2 wheeled) Gait Pattern/deviations: Step-through pattern;Wide base of support   Gait velocity interpretation: <1.8 ft/sec, indicative of risk for recurrent falls     Stairs            Wheelchair Mobility    Modified Rankin (Stroke Patients Only)       Balance Overall balance assessment: History of Falls;Needs assistance Sitting-balance support: Bilateral upper extremity supported;Feet supported Sitting balance-Leahy Scale: Good     Standing balance support: Bilateral upper extremity supported Standing balance-Leahy Scale: Fair                      Cognition Arousal/Alertness: Awake/alert Behavior During Therapy: WFL for tasks assessed/performed Overall Cognitive Status: Within Functional Limits for tasks assessed                      Exercises      General Comments        Pertinent Vitals/Pain Pain Assessment: No/denies pain    Home Living                      Prior Function  PT Goals (current goals can now be found in the care plan section) Acute Rehab PT Goals Patient Stated Goal: To get stronger and go home.  PT Goal Formulation: With patient/family Time For Goal Achievement: 09/26/16 Potential to Achieve Goals: Fair Progress towards PT goals: Progressing toward goals    Frequency    Min 3X/week      PT Plan Current plan remains appropriate    Co-evaluation             End of Session Equipment Utilized During Treatment: Gait belt Activity Tolerance: Patient tolerated treatment well Patient left: in bed;with call bell/phone  within reach Nurse Communication: Mobility status PT Visit Diagnosis: Other abnormalities of gait and mobility (R26.89);Muscle weakness (generalized) (M62.81)     Time: 2725-3664 PT Time Calculation (min) (ACUTE ONLY): 27 min  Charges:  $Gait Training: 8-22 mins $Therapeutic Activity: 8-22 mins                    G Codes:       Beth Shatima Zalar, PT, DPT X: [phone]

## 2016-09-21 NOTE — Discharge Instructions (Signed)
Acute Kidney Injury, Adult Acute kidney injury is a sudden worsening of kidney function. The kidneys are organs that have several jobs. They filter the blood to remove waste products and extra fluid. They also maintain a healthy balance of minerals and hormones in the body, which helps control blood pressure and keep bones strong. With this condition, your kidneys do not do their jobs as well as they should. This condition ranges from mild to severe. Over time it may develop into long-lasting (chronic) kidney disease. Early detection and treatment may prevent acute kidney injury from developing into a chronic condition. What are the causes? Common causes of this condition include:  A problem with blood flow to the kidneys. This may be caused by:  Low blood pressure (hypotension) or shock.  Blood loss.  Heart and blood vessel (cardiovascular) disease.  Severe burns.  Liver disease.  Direct damage to the kidneys. This may be caused by:  Certain medicines.  A kidney infection.  Poisoning.  Being around or in contact with toxic substances.  A surgical wound.  A hard, direct hit to the kidney area.  A sudden blockage of urine flow. This may be caused by:  Cancer.  Kidney stones.  An enlarged prostate in males. What are the signs or symptoms? Symptoms of this condition may not be obvious until the condition becomes severe. Symptoms of this condition can include:  Tiredness (lethargy), or difficulty staying awake.  Nausea or vomiting.  Swelling (edema) of the face, legs, ankles, or feet.  Problems with urination, such as:  Abdominal pain, or pain along the side of your stomach (flank).  Decreased urine production.  Decrease in the force of urine flow.  Muscle twitches and cramps, especially in the legs.  Confusion or trouble concentrating.  Loss of appetite.  Fever. How is this diagnosed? This condition may be diagnosed with tests, including:  Blood  tests.  Urine tests.  Imaging tests.  A test in which a sample of tissue is removed from the kidneys to be examined under a microscope (kidney biopsy). How is this treated? Treatment for this condition depends on the cause and how severe the condition is. In mild cases, treatment may not be needed. The kidneys may heal on their own. In more severe cases, treatment will involve:  Treating the cause of the kidney injury. This may involve changing any medicines you are taking or adjusting your dosage.  Fluids. You may need specialized IV fluids to balance your body's needs.  Having a catheter placed to drain urine and prevent blockages.  Preventing problems from occurring. This may mean avoiding certain medicines or procedures that can cause further injury to the kidneys. In some cases treatment may also require:  A procedure to remove toxic wastes from the body (dialysis or continuous renal replacement therapy - CRRT).  Surgery. This may be done to repair a torn kidney, or to remove the blockage from the urinary system. Follow these instructions at home: Medicines   Take over-the-counter and prescription medicines only as told by your health care provider.  Do not take any new medicines without your health care provider's approval. Many medicines can worsen your kidney damage.  Do not take any vitamin and mineral supplements without your health care provider's approval. Many nutritional supplements can worsen your kidney damage. Lifestyle   If your health care provider prescribed changes to your diet, follow them. You may need to decrease the amount of protein you eat.  Achieve and maintain a   healthy weight. If you need help with this, ask your health care provider.  Start or continue an exercise plan. Try to exercise at least 30 minutes a day, 5 days a week.  Do not use any tobacco products, such as cigarettes, chewing tobacco, and e-cigarettes. If you need help quitting, ask  your health care provider. General instructions   Keep track of your blood pressure. Report changes in your blood pressure as told by your health care provider.  Stay up to date with immunizations. Ask your health care provider which immunizations you need.  Keep all follow-up visits as told by your health care provider. This is important. Where to find more information:  American Association of Kidney Patients: www.aakp.org  National Kidney Foundation: www.kidney.org  American Kidney Fund: www.akfinc.org  [facility]:  www.lifeoptions.org  www.kidneyschool.org Contact a health care provider if:  Your symptoms get worse.  You develop new symptoms. Get help right away if:  You develop symptoms of worsening kidney disease, which include:  Headaches.  Abnormally dark or light skin.  Easy bruising.  Frequent hiccups.  Chest pain.  Shortness of breath.  End of menstruation in women.  Seizures.  Confusion or altered mental status.  Abdominal or back pain.  Itchiness.  You have a fever.  Your body is producing less urine.  You have pain or bleeding when you urinate. Summary  Acute kidney injury is a sudden worsening of kidney function.  Acute kidney injury can be caused by problems with blood flow to the kidneys, direct damage to the kidneys, and sudden blockage of urine flow.  Symptoms of this condition may not be obvious until it becomes severe. Symptoms may include edema, lethargy, confusion, nausea or vomiting, and problems passing urine.  This condition can usually be diagnosed with blood tests, urine tests, and imaging tests. Sometimes a kidney biopsy is done to diagnose this condition.  Treatment for this condition often involves treating the underlying cause. It is treated with fluids, medicines, dialysis, diet changes, or surgery. This information is not intended to replace advice given to you by your health care provider.  Make sure you discuss any questions you have with your health care provider. Document Released: 01/17/2011 Document Revised: 06/24/2016 Document Reviewed: 06/24/2016 Elsevier Interactive Patient Education  2017 Elsevier Inc.  

## 2016-10-27 DIAGNOSIS — I2583 Coronary atherosclerosis due to lipid rich plaque: Secondary | ICD-10-CM

## 2016-10-27 DIAGNOSIS — I2510 Atherosclerotic heart disease of native coronary artery without angina pectoris: Secondary | ICD-10-CM

## 2016-10-27 DIAGNOSIS — F411 Generalized anxiety disorder: Secondary | ICD-10-CM | POA: Insufficient documentation

## 2016-10-27 DIAGNOSIS — F410 Panic disorder [episodic paroxysmal anxiety] without agoraphobia: Secondary | ICD-10-CM | POA: Insufficient documentation

## 2016-10-27 DIAGNOSIS — Z23 Encounter for immunization: Secondary | ICD-10-CM | POA: Insufficient documentation

## 2016-10-27 HISTORY — DX: Atherosclerotic heart disease of native coronary artery without angina pectoris: I25.10

## 2016-11-11 DIAGNOSIS — F0150 Vascular dementia without behavioral disturbance: Secondary | ICD-10-CM | POA: Insufficient documentation

## 2016-11-11 DIAGNOSIS — E785 Hyperlipidemia, unspecified: Secondary | ICD-10-CM | POA: Insufficient documentation

## 2016-11-11 HISTORY — DX: Vascular dementia, unspecified severity, without behavioral disturbance, psychotic disturbance, mood disturbance, and anxiety: F01.50

## 2016-12-07 DIAGNOSIS — F331 Major depressive disorder, recurrent, moderate: Secondary | ICD-10-CM

## 2016-12-07 DIAGNOSIS — L659 Nonscarring hair loss, unspecified: Secondary | ICD-10-CM | POA: Insufficient documentation

## 2016-12-07 HISTORY — DX: Major depressive disorder, recurrent, moderate: F33.1
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
## 2016-12-28 ENCOUNTER — Other Ambulatory Visit (HOSPITAL_COMMUNITY): Payer: Self-pay

## 2016-12-28 ENCOUNTER — Encounter (HOSPITAL_COMMUNITY): Payer: Self-pay | Admitting: Psychiatry

## 2016-12-28 ENCOUNTER — Ambulatory Visit (INDEPENDENT_AMBULATORY_CARE_PROVIDER_SITE_OTHER): Payer: Medicare Other | Admitting: Psychiatry

## 2016-12-28 ENCOUNTER — Encounter (INDEPENDENT_AMBULATORY_CARE_PROVIDER_SITE_OTHER): Payer: Self-pay

## 2016-12-28 VITALS — BP 110/74 | HR 70 | Wt 174.2 lb

## 2016-12-28 DIAGNOSIS — Z888 Allergy status to other drugs, medicaments and biological substances status: Secondary | ICD-10-CM

## 2016-12-28 DIAGNOSIS — R609 Edema, unspecified: Secondary | ICD-10-CM | POA: Diagnosis not present

## 2016-12-28 DIAGNOSIS — I10 Essential (primary) hypertension: Secondary | ICD-10-CM | POA: Diagnosis not present

## 2016-12-28 DIAGNOSIS — F419 Anxiety disorder, unspecified: Secondary | ICD-10-CM

## 2016-12-28 DIAGNOSIS — Z818 Family history of other mental and behavioral disorders: Secondary | ICD-10-CM | POA: Diagnosis not present

## 2016-12-28 DIAGNOSIS — Z79899 Other long term (current) drug therapy: Secondary | ICD-10-CM | POA: Diagnosis not present

## 2016-12-28 DIAGNOSIS — E785 Hyperlipidemia, unspecified: Secondary | ICD-10-CM | POA: Diagnosis not present

## 2016-12-28 DIAGNOSIS — Z8673 Personal history of transient ischemic attack (TIA), and cerebral infarction without residual deficits: Secondary | ICD-10-CM

## 2016-12-28 DIAGNOSIS — F4323 Adjustment disorder with mixed anxiety and depressed mood: Secondary | ICD-10-CM | POA: Insufficient documentation

## 2016-12-28 DIAGNOSIS — E1121 Type 2 diabetes mellitus with diabetic nephropathy: Secondary | ICD-10-CM

## 2016-12-28 DIAGNOSIS — Z7982 Long term (current) use of aspirin: Secondary | ICD-10-CM | POA: Diagnosis not present

## 2016-12-28 DIAGNOSIS — Z7984 Long term (current) use of oral hypoglycemic drugs: Secondary | ICD-10-CM

## 2016-12-28 DIAGNOSIS — F330 Major depressive disorder, recurrent, mild: Secondary | ICD-10-CM | POA: Diagnosis not present

## 2016-12-28 MED ORDER — CITALOPRAM HYDROBROMIDE 20 MG PO TABS: 20.0000 mg | ORAL_TABLET | Freq: Every day | ORAL | 0 refills | Status: DC

## 2016-12-28 NOTE — Telephone Encounter (Signed)
pt daughter called states that her mother had taken the celex 20mg  [facility]. pt daughter states that a Dr. Rayfield CitizenBulter had increased the celexa to 40mg  once a day.  pt was seen today and received the celex 20mg .  Pt daughter wants to know if she can have the celexa 40mg  states her mother was doing much better on the 40mg  .

## 2016-12-28 NOTE — Patient Instructions (Addendum)
1. Continue celexa 20 mg daily 2. Return to clinic in one month for 30 mins 3. Contact for therapy: Dr. Daisy BlossomSara W Schneidmiller  [phone] [address], [place], [place] [address] 5. Return to clinic in January

## 2016-12-29 ENCOUNTER — Telehealth (HOSPITAL_COMMUNITY): Payer: Self-pay | Admitting: Psychiatry

## 2016-12-29 MED ORDER — CITALOPRAM HYDROBROMIDE 20 MG PO TABS: 20.0000 mg | ORAL_TABLET | Freq: Every day | ORAL | 1 refills | Status: DC

## 2016-12-29 NOTE — Telephone Encounter (Signed)
Called a patient. She states that she used take citalopram 40 mg for unknown amount of time. She was told that she could not get the medication as it is early to get refill. Discussed with patient that given the concern for adverse risk of QTc prolongation in geriatric population, she is advised to take 20 mg daily.   Discussed with pharmacy. The insurance will not cover her medication until 6/20; she can get medication if she is able to pay out of pocket.   Called patient again to inform this.

## 2017-01-09 NOTE — Telephone Encounter (Signed)
citalopram (CELEXA) 20 MG tablet  Medication  Date: 12/29/2016 Department: [facility] Ordering/Authorizing: Neysa HotterHisada, Reina, MD  Order Providers   Prescribing Provider Encounter Provider  Neysa HotterHisada, Reina, MD Elvina MattesNorton, Jaidah Lomax L, CMA  Medication Detail    Disp Refills Start End   citalopram (CELEXA) 20 MG tablet 30 tablet 1 12/29/2016    Sig - Route: Take 1 tablet (20 mg total) by mouth daily. - Oral   E-Prescribing Status: Receipt confirmed by pharmacy (12/29/2016 9:25 AM EDT)

## 2017-01-23 NOTE — Progress Notes (Deleted)
BH MD/PA/NP OP Progress Note  01/23/2017 2:10 PM Kendra White  MRN:  [id number]  Chief Complaint:  Subjective:  *** HPI:  - Patient was advised to continue citalopram 20 mg daily given concern for QTc prolongation at higher dose.    Visit Diagnosis: No diagnosis found.  Past Psychiatric History:  I have reviewed the patient's psychiatry history in detail and updated the patient record. Outpatient: denies Psychiatry admission: denies Previous suicide attempt: denies Past trials of medication: Celexa for one year, Xanax, Aricept History of violence:  denies  Past Medical History:  Past Medical History:  Diagnosis Date  . Acute renal insufficiency 09/16/2016  . Anxiety   . Blood transfusion   . Coronary artery disease due to lipid rich plaque 10/27/2016  . Dependent edema   . Diabetes mellitus   . Essential hypertension 08/12/2016  . Headache(784.0)   . Hx-TIA (transient ischemic attack)   . Hypercholesteremia   . Moderate episode of recurrent major depressive disorder (HCC) 12/07/2016  . Sciatica, left side 08/12/2016  . Swelling of ankle    and abdomen  . Ulcer   . Vascular dementia without behavioral disturbance 11/11/2016    Past Surgical History:  Procedure Laterality Date  . ABDOMINAL HYSTERECTOMY    . COLONOSCOPY    . DILATION AND CURETTAGE OF UTERUS    . LAPAROSCOPIC APPENDECTOMY  02/07/2011   Procedure: APPENDECTOMY LAPAROSCOPIC;  Surgeon: Dalia HeadingMark A Jenkins;  Location: [facility];  Service: General;  Laterality: N/A;  . POLYPECTOMY      Family Psychiatric History:  I have reviewed the patient's family history in detail and updated the patient record. Mother- may have bipolar disorder, cousin- committed suicide, older daughter- may overuse pain medication  Family History:  Family History  Problem Relation Age of Onset  . Colon cancer Father   . Colon polyps Maternal Aunt   . Pancreatic cancer Brother     Social History:  Social History   Social History  .  Marital status: Married    Spouse name: N/A  . Number of children: N/A  . Years of education: N/A   Social History Main Topics  . Smoking status: Never Smoker  . Smokeless tobacco: Never Used  . Alcohol use No  . Drug use: No  . Sexual activity: Yes    Birth control/ protection: None   Other Topics Concern  . Not on file   Social History Narrative  . No narrative on file    Allergies:  Allergies  Allergen Reactions  . Atorvastatin     Other reaction(s): Constipation  . Diphenhydramine Hcl Rash  . Rosuvastatin     Other reaction(s): Constipation    Metabolic Disorder Labs: Lab Results  Component Value Date   HGBA1C 6.5 (H) 02/07/2011   MPG 140 (H) 02/07/2011   No results found for: PROLACTIN No results found for: CHOL, TRIG, HDL, CHOLHDL, VLDL, LDLCALC   Current Medications: Current Outpatient Prescriptions  Medication Sig Dispense Refill  . acetaminophen (TYLENOL) 325 MG tablet Take 325-650 mg by mouth daily as needed for mild pain or moderate pain. For pain     . ALPRAZolam (XANAX) 0.25 MG tablet Take 0.125-0.25 mg by mouth 3 (three) times daily as needed for anxiety.     Marland Kitchen. aspirin 81 MG EC tablet Take 81 mg by mouth daily.      . Biotin 5000 MCG CAPS Take by mouth daily.    . citalopram (CELEXA) 20 MG tablet Take 1 tablet (20  mg total) by mouth daily. 30 tablet 1  . fluticasone (FLONASE) 50 MCG/ACT nasal spray Place 2 sprays into both nostrils daily. 1 g 0  . furosemide (LASIX) 40 MG tablet Take 0.5 tablets (20 mg total) by mouth every morning. 30 tablet 0  . Garlic 1000 MG CAPS Take 1,000 mg by mouth daily.     Marland Kitchen loratadine (CLARITIN) 10 MG tablet Take 1 tablet (10 mg total) by mouth daily. 30 tablet 0  . metFORMIN (GLUCOPHAGE) 500 MG tablet Take 500 mg by mouth at bedtime.     . Multiple Vitamins-Minerals (MULTIVITAMIN WITH MINERALS) tablet Take 1 tablet by mouth daily.      . primidone (MYSOLINE) 50 MG tablet Take 50 mg by mouth 2 (two) times daily.    .  propranolol (INDERAL) 40 MG tablet Take 40 mg by mouth daily.     . rosuvastatin (CRESTOR) 10 MG tablet Take 1 tablet (10 mg total) by mouth daily at 6 PM. 30 tablet 0   No current facility-administered medications for this visit.     Neurologic: Headache: No Seizure: No Paresthesias: No  Musculoskeletal: Strength & Muscle Tone: within normal limits Gait & Station: normal Patient leans: N/A  Psychiatric Specialty Exam: ROS  There were no vitals taken for this visit.There is no height or weight on file to calculate BMI.  General Appearance: Fairly Groomed  Eye Contact:  Good  Speech:  Clear and Coherent  Volume:  Normal  Mood:  {BHH MOOD:22306}  Affect:  {Affect (PAA):22687}  Thought Process:  Coherent and Goal Directed  Orientation:  Full (Time, Place, and Person)  Thought Content: Logical   Suicidal Thoughts:  {ST/HT (PAA):22692}  Homicidal Thoughts:  {ST/HT (PAA):22692}  Memory:  Immediate;   Good Recent;   Good Remote;   Good  Judgement:  {Judgement (PAA):22694}  Insight:  {Insight (PAA):22695}  Psychomotor Activity:  Normal  Concentration:  Concentration: Good and Attention Span: Good  Recall:  Good  Fund of Knowledge: Good  Language: Good  Akathisia:  No  Handed:  Right  AIMS (if indicated):  N/A  Assets:  Communication Skills Desire for Improvement  ADL's:  Intact  Cognition: WNL  Sleep:  ***   Assessment Kendra White is a 83 year old female with anxiety, type II diabetes, dependent edema on Lasix,hypertension, hyperlipidemia, history of TIA. Patient presents for follow up appointment for No diagnosis found.  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 Will restart Celexa (she ran out of her medication for one week) to target her mood symptoms. Discussed with patient that this medication may need to be switched in the future if  she continues to endorse mood symptoms despite treatment, given we cannot try higher dose in elderly population. She will greatly benefit from supportive therapy. She prefers location closer to her place. Referral information is provided.   # r/o mild neurocognitive disorder  Patient endorses memory loss; it is difficult to discern in the setting of her mood symptoms. Will continue to monitor. Noted that her PCP started her on Aricept.   Plan 1. Continue Celexa 20 mg daily 2. Return to clinic in one month for 30 mins 3. Contact for therapy: Dr. Daisy Blossom Schneidmiller  [phone] [address], [place], [place] [address] 5. Return to clinic in January  The patient demonstrates the following risk factors for suicide: Chronic  risk factors for suicide include: psychiatric disorder of depression. Acute risk factors for suicide include: family or marital conflict. Protective factors for this patient include: positive social support, coping skills and hope for the future. Considering these factors, the overall suicide risk at this point appears to be low. Patient is appropriate for outpatient follow up.  Treatment Plan Summary:Plan as above   Neysa Hotter, MD 01/23/2017, 2:10 PM

## 2017-01-27 ENCOUNTER — Ambulatory Visit (HOSPITAL_COMMUNITY): Payer: Self-pay | Admitting: Psychiatry

## 2017-03-30 ENCOUNTER — Emergency Department (HOSPITAL_COMMUNITY): Payer: Medicare Other | Admitting: Anesthesiology

## 2017-03-30 ENCOUNTER — Encounter (HOSPITAL_COMMUNITY)
Admission: EM | Disposition: A | Discharge status: Home / Self Care | Payer: Self-pay | Source: Home / Self Care | Attending: Emergency Medicine

## 2017-03-30 ENCOUNTER — Observation Stay (HOSPITAL_COMMUNITY)
Admission: EM | Admit: 2017-03-30 | Discharge: 2017-03-31 | Disposition: A | Discharge status: Home / Self Care | Payer: Medicare Other | Attending: Internal Medicine | Admitting: Internal Medicine

## 2017-03-30 ENCOUNTER — Encounter (HOSPITAL_COMMUNITY): Payer: Self-pay

## 2017-03-30 DIAGNOSIS — B9681 Helicobacter pylori [H. pylori] as the cause of diseases classified elsewhere: Secondary | ICD-10-CM | POA: Diagnosis not present

## 2017-03-30 DIAGNOSIS — K259 Gastric ulcer, unspecified as acute or chronic, without hemorrhage or perforation: Secondary | ICD-10-CM | POA: Insufficient documentation

## 2017-03-30 DIAGNOSIS — E7800 Pure hypercholesterolemia, unspecified: Secondary | ICD-10-CM | POA: Insufficient documentation

## 2017-03-30 DIAGNOSIS — K2950 Unspecified chronic gastritis without bleeding: Secondary | ICD-10-CM | POA: Insufficient documentation

## 2017-03-30 DIAGNOSIS — F0150 Vascular dementia without behavioral disturbance: Secondary | ICD-10-CM | POA: Insufficient documentation

## 2017-03-30 DIAGNOSIS — K253 Acute gastric ulcer without hemorrhage or perforation: Secondary | ICD-10-CM | POA: Diagnosis not present

## 2017-03-30 DIAGNOSIS — I10 Essential (primary) hypertension: Secondary | ICD-10-CM | POA: Insufficient documentation

## 2017-03-30 DIAGNOSIS — D62 Acute posthemorrhagic anemia: Secondary | ICD-10-CM | POA: Diagnosis not present

## 2017-03-30 DIAGNOSIS — F419 Anxiety disorder, unspecified: Secondary | ICD-10-CM | POA: Diagnosis not present

## 2017-03-30 DIAGNOSIS — Z8673 Personal history of transient ischemic attack (TIA), and cerebral infarction without residual deficits: Secondary | ICD-10-CM | POA: Diagnosis not present

## 2017-03-30 DIAGNOSIS — Z800 Family history of malignant neoplasm of digestive organs: Secondary | ICD-10-CM | POA: Diagnosis not present

## 2017-03-30 DIAGNOSIS — Z90710 Acquired absence of both cervix and uterus: Secondary | ICD-10-CM | POA: Insufficient documentation

## 2017-03-30 DIAGNOSIS — K922 Gastrointestinal hemorrhage, unspecified: Secondary | ICD-10-CM | POA: Diagnosis present

## 2017-03-30 DIAGNOSIS — K3189 Other diseases of stomach and duodenum: Secondary | ICD-10-CM | POA: Diagnosis not present

## 2017-03-30 DIAGNOSIS — Z888 Allergy status to other drugs, medicaments and biological substances status: Secondary | ICD-10-CM | POA: Diagnosis not present

## 2017-03-30 DIAGNOSIS — K921 Melena: Secondary | ICD-10-CM

## 2017-03-30 DIAGNOSIS — Z7982 Long term (current) use of aspirin: Secondary | ICD-10-CM | POA: Diagnosis not present

## 2017-03-30 DIAGNOSIS — I2510 Atherosclerotic heart disease of native coronary artery without angina pectoris: Secondary | ICD-10-CM | POA: Insufficient documentation

## 2017-03-30 DIAGNOSIS — Z8371 Family history of colonic polyps: Secondary | ICD-10-CM | POA: Insufficient documentation

## 2017-03-30 DIAGNOSIS — K920 Hematemesis: Secondary | ICD-10-CM | POA: Insufficient documentation

## 2017-03-30 DIAGNOSIS — Z79899 Other long term (current) drug therapy: Secondary | ICD-10-CM | POA: Insufficient documentation

## 2017-03-30 DIAGNOSIS — E119 Type 2 diabetes mellitus without complications: Secondary | ICD-10-CM | POA: Insufficient documentation

## 2017-03-30 DIAGNOSIS — M5432 Sciatica, left side: Secondary | ICD-10-CM | POA: Diagnosis not present

## 2017-03-30 HISTORY — PX: ESOPHAGOGASTRODUODENOSCOPY (EGD) WITH PROPOFOL: SHX5813

## 2017-03-30 LAB — I-STAT CHEM 8, ED
BUN: 56 mg/dL — ABNORMAL HIGH (ref 6–20)
CALCIUM ION: 1.18 mmol/L (ref 1.15–1.40)
CHLORIDE: 104 mmol/L (ref 101–111)
Creatinine, Ser: 1 mg/dL (ref 0.44–1.00)
GLUCOSE: 158 mg/dL — AB (ref 65–99)
HCT: 24 % — ABNORMAL LOW (ref 36.0–46.0)
HEMOGLOBIN: 8.2 g/dL — AB (ref 12.0–15.0)
Potassium: 4.5 mmol/L (ref 3.5–5.1)
Sodium: 139 mmol/L (ref 135–145)
TCO2: 25 mmol/L (ref 22–32)

## 2017-03-30 LAB — CBC
HCT: 24.3 % — ABNORMAL LOW (ref 36.0–46.0)
Hemoglobin: 8.3 g/dL — ABNORMAL LOW (ref 12.0–15.0)
MCH: 30.5 pg (ref 26.0–34.0)
MCHC: 34.2 g/dL (ref 30.0–36.0)
MCV: 89.3 fL (ref 78.0–100.0)
Platelets: 257 10*3/uL (ref 150–400)
RBC: 2.72 MIL/uL — ABNORMAL LOW (ref 3.87–5.11)
RDW: 13.5 % (ref 11.5–15.5)
WBC: 8.5 10*3/uL (ref 4.0–10.5)

## 2017-03-30 LAB — GLUCOSE, CAPILLARY
GLUCOSE-CAPILLARY: 188 mg/dL — AB (ref 65–99)
GLUCOSE-CAPILLARY: 136 mg/dL — AB (ref 65–99)

## 2017-03-30 LAB — LIPASE, BLOOD: Lipase: 34 U/L (ref 11–51)

## 2017-03-30 LAB — CBC WITH DIFFERENTIAL/PLATELET
BASOS PCT: 1 %
Basophils Absolute: 0.1 10*3/uL (ref 0.0–0.1)
EOS ABS: 0 10*3/uL (ref 0.0–0.7)
EOS PCT: 0 %
HCT: 25.8 % — ABNORMAL LOW (ref 36.0–46.0)
Hemoglobin: 8.8 g/dL — ABNORMAL LOW (ref 12.0–15.0)
LYMPHS ABS: 1.5 10*3/uL (ref 0.7–4.0)
Lymphocytes Relative: 19 %
MCH: 30.6 pg (ref 26.0–34.0)
MCHC: 34.1 g/dL (ref 30.0–36.0)
MCV: 89.6 fL (ref 78.0–100.0)
Monocytes Absolute: 0.6 10*3/uL (ref 0.1–1.0)
Monocytes Relative: 8 %
Neutro Abs: 5.7 10*3/uL (ref 1.7–7.7)
Neutrophils Relative %: 72 %
PLATELETS: 262 10*3/uL (ref 150–400)
RBC: 2.88 MIL/uL — AB (ref 3.87–5.11)
RDW: 13.3 % (ref 11.5–15.5)
WBC: 7.8 10*3/uL (ref 4.0–10.5)

## 2017-03-30 LAB — COMPREHENSIVE METABOLIC PANEL
ALBUMIN: 3 g/dL — AB (ref 3.5–5.0)
ALT: 20 U/L (ref 14–54)
AST: 21 U/L (ref 15–41)
Alkaline Phosphatase: 41 U/L (ref 38–126)
Anion gap: 4 — ABNORMAL LOW (ref 5–15)
BUN: 59 mg/dL — ABNORMAL HIGH (ref 6–20)
CHLORIDE: 107 mmol/L (ref 101–111)
CO2: 27 mmol/L (ref 22–32)
CREATININE: 0.97 mg/dL (ref 0.44–1.00)
Calcium: 8.5 mg/dL — ABNORMAL LOW (ref 8.9–10.3)
GFR calc non Af Amer: 52 mL/min — ABNORMAL LOW (ref >60)
Glucose, Bld: 167 mg/dL — ABNORMAL HIGH (ref 65–99)
Potassium: 4.4 mmol/L (ref 3.5–5.1)
SODIUM: 138 mmol/L (ref 135–145)
Total Bilirubin: 0.4 mg/dL (ref 0.3–1.2)
Total Protein: 5.9 g/dL — ABNORMAL LOW (ref 6.5–8.1)

## 2017-03-30 LAB — TYPE AND SCREEN
ABO/RH(D): A POS
Antibody Screen: NEGATIVE

## 2017-03-30 LAB — ABO/RH: ABO/RH(D): A POS

## 2017-03-30 SURGERY — ESOPHAGOGASTRODUODENOSCOPY (EGD) WITH PROPOFOL
Anesthesia: General

## 2017-03-30 MED ORDER — SODIUM CHLORIDE 0.9 % IV SOLN: INTRAVENOUS | Status: DC

## 2017-03-30 MED ORDER — PROPOFOL 10 MG/ML IV BOLUS
INTRAVENOUS | Status: AC
  Filled 2017-03-30: qty 40

## 2017-03-30 MED ORDER — SODIUM CHLORIDE 0.9 % IV BOLUS (SEPSIS)
1000.0000 mL | Freq: Once | INTRAVENOUS | Status: AC
  Administered 2017-03-30: 1000 mL via INTRAVENOUS

## 2017-03-30 MED ORDER — ACETAMINOPHEN 650 MG RE SUPP: 650.0000 mg | Freq: Four times a day (QID) | RECTAL | Status: DC | PRN

## 2017-03-30 MED ORDER — PROPOFOL 500 MG/50ML IV EMUL
INTRAVENOUS | Status: DC | PRN
  Administered 2017-03-30: 100 ug/kg/min via INTRAVENOUS

## 2017-03-30 MED ORDER — SODIUM CHLORIDE 0.9 % IV SOLN
8.0000 mg/h | INTRAVENOUS | Status: DC
  Administered 2017-03-30 – 2017-03-31 (×3): 8 mg/h via INTRAVENOUS
  Filled 2017-03-30 (×5): qty 80

## 2017-03-30 MED ORDER — INSULIN ASPART 100 UNIT/ML ~~LOC~~ SOLN
0.0000 [IU] | Freq: Three times a day (TID) | SUBCUTANEOUS | Status: DC
  Administered 2017-03-30: 2 [IU] via SUBCUTANEOUS
  Administered 2017-03-31: 1 [IU] via SUBCUTANEOUS
  Administered 2017-03-31: 3 [IU] via SUBCUTANEOUS

## 2017-03-30 MED ORDER — SODIUM CHLORIDE 0.9 % IV SOLN: 250.0000 mL | INTRAVENOUS | Status: DC | PRN

## 2017-03-30 MED ORDER — LIDOCAINE HCL (CARDIAC) 20 MG/ML IV SOLN
INTRAVENOUS | Status: DC | PRN
  Administered 2017-03-30: 100 mg via INTRATRACHEAL

## 2017-03-30 MED ORDER — ACETAMINOPHEN 325 MG PO TABS: 650.0000 mg | ORAL_TABLET | Freq: Four times a day (QID) | ORAL | Status: DC | PRN

## 2017-03-30 MED ORDER — SODIUM CHLORIDE 0.9% FLUSH: 3.0000 mL | INTRAVENOUS | Status: DC | PRN

## 2017-03-30 MED ORDER — LIDOCAINE 2% (20 MG/ML) 5 ML SYRINGE
INTRAMUSCULAR | Status: AC
  Filled 2017-03-30: qty 5

## 2017-03-30 MED ORDER — ONDANSETRON HCL 4 MG/2ML IJ SOLN
4.0000 mg | Freq: Once | INTRAMUSCULAR | Status: AC
  Administered 2017-03-30: 4 mg via INTRAVENOUS
  Filled 2017-03-30: qty 2

## 2017-03-30 MED ORDER — PROPOFOL 10 MG/ML IV BOLUS
INTRAVENOUS | Status: DC | PRN
  Administered 2017-03-30: 60 mg via INTRAVENOUS

## 2017-03-30 MED ORDER — LACTATED RINGERS IV SOLN
INTRAVENOUS | Status: AC | PRN
  Administered 2017-03-30: 13:00:00 via INTRAVENOUS
  Administered 2017-03-30: 1000 mL via INTRAVENOUS

## 2017-03-30 MED ORDER — SODIUM CHLORIDE 0.9 % IV SOLN
80.0000 mg | Freq: Once | INTRAVENOUS | Status: AC
  Administered 2017-03-30: 80 mg via INTRAVENOUS
  Filled 2017-03-30: qty 80

## 2017-03-30 MED ORDER — SODIUM CHLORIDE 0.9% FLUSH
3.0000 mL | Freq: Two times a day (BID) | INTRAVENOUS | Status: DC
  Administered 2017-03-31: 3 mL via INTRAVENOUS

## 2017-03-30 SURGICAL SUPPLY — 14 items

## 2017-03-30 NOTE — ED Notes (Signed)
Pt transported to GI

## 2017-03-30 NOTE — ED Notes (Signed)
Bed: [id number] Expected date:  Expected time:  Means of arrival:  Comments: Pt in procedure, will return to room

## 2017-03-30 NOTE — Op Note (Signed)
[facility] Patient Name: Kendra White Procedure Date: 03/30/2017 MRN: [id number] Attending MD: Estill Cotta. Loletha Carrow , MD Date of Birth: [birth year] CSN: [id number] Age: 83 Admit Type: Outpatient Procedure:                Upper GI endoscopy Indications:              Acute post hemorrhagic anemia, Hematemesis, Melena Providers:                Mallie Mussel L. Loletha Carrow, MD, Burtis Junes, RN, William Dalton,                            Technician Referring MD:              Medicines:                Monitored Anesthesia Care Complications:            No immediate complications. Estimated Blood Loss:     Estimated blood loss: none. Estimated blood loss                            was minimal. Procedure:                Pre-Anesthesia Assessment:                           - Prior to the procedure, a History and Physical                            was performed, and patient medications and                            allergies were reviewed. The patient's tolerance of                            previous anesthesia was also reviewed. The risks                            and benefits of the procedure and the sedation                            options and risks were discussed with the patient.                            All questions were answered, and informed consent                            was obtained. Prior Anticoagulants: The patient has                            taken aspirin, last dose was 1 day prior to                            procedure. ASA Grade Assessment: III - A patient  with severe systemic disease. After reviewing the                            risks and benefits, the patient was deemed in                            satisfactory condition to undergo the procedure.                           After obtaining informed consent, the endoscope was                            passed under direct vision. Throughout the                            procedure, the  patient's blood pressure, pulse, and                            oxygen saturations were monitored continuously. The                            [id number] ([id number]) scope was introduced through the                            mouth, and advanced to the second part of duodenum.                            The upper GI endoscopy was accomplished without                            difficulty. The patient tolerated the procedure                            well. Scope In: Scope Out: Findings:      The larynx was normal.      The esophagus was normal.      Two non-bleeding cratered gastric ulcers with pigmented material were       found at the pylorus. The largest lesion was 6 mm in largest dimension.       Random gastric biopsies were taken with a cold forceps for histology to       rule out H. pylori. (Sidney protocol).      A medium amount of food (residue) and scattered old blood was found in       the entire examined stomach. This limited visualization. There was no       fresh blood in the UGI tract.      A deformity was found at the pylorus due to ulcer and edema. The culprit       ulcer was small but with a red spot. It was not actively bleeding. No       intervention could be taken due to its eccentric position.      The cardia and gastric fundus were normal on retroflexion.      The examined duodenum was normal. Impression:               -  Normal larynx.                           - Normal esophagus.                           - Non-bleeding gastric ulcers with pigmented                            material. Biopsied.                           - A medium amount of food (residue) in the stomach.                           - Deformity in the pylorus.                           - Normal examined duodenum. Moderate Sedation:      MAC sedation used Recommendation:           - Return patient to hospital ward for ongoing care.                           - Clear liquid diet.                            - Continue present medications.                           - Await pathology results.                           - Q 8 hour Hgb/hct. If stabilizes, perhaps home in                            next 1-2 days. If continues to drop, repeat EGD. Procedure Code(s):        --- Professional ---                           [phone], Esophagogastroduodenoscopy, flexible,                            transoral; with biopsy, single or multiple Diagnosis Code(s):        --- Professional ---                           K25.9, Gastric ulcer, unspecified as acute or                            chronic, without hemorrhage or perforation                           K31.89, Other diseases of stomach and duodenum                           D62, Acute posthemorrhagic anemia  K92.0, Hematemesis                           K92.1, Melena (includes Hematochezia) CPT copyright 2016 American Medical Association. All rights reserved. The codes documented in this report are preliminary and upon coder review may  be revised to meet current compliance requirements. Trystin Terhune L. Loletha Carrow, MD 03/30/2017 1:34:08 PM This report has been signed electronically. Number of Addenda: 0

## 2017-03-30 NOTE — ED Notes (Signed)
Bed: [id number] Expected date:  Expected time:  Means of arrival:  Comments: EMS 83 y/o hematemesis

## 2017-03-30 NOTE — Anesthesia Postprocedure Evaluation (Signed)
Anesthesia Post Note  Patient: Kendra White  Procedure(s) Performed: Procedure(s) (LRB): ESOPHAGOGASTRODUODENOSCOPY (EGD) WITH PROPOFOL (N/A)     Patient location during evaluation: Endoscopy Anesthesia Type: General Level of consciousness: awake and alert Pain management: pain level controlled Vital Signs Assessment: post-procedure vital signs reviewed and stable Respiratory status: spontaneous breathing, nonlabored ventilation, respiratory function stable and patient connected to nasal cannula oxygen Cardiovascular status: stable and blood pressure returned to baseline Postop Assessment: no apparent nausea or vomiting Anesthetic complications: no    Last Vitals:  Vitals:   03/30/17 1350 03/30/17 1408  BP: (!) 131/46 (!) 122/51  Pulse: 86 86  Resp: 13 18  Temp:    SpO2: 98% 97%    Last Pain:  Vitals:   03/30/17 1329  TempSrc: Oral                 Phillips Groutarignan, Helma Argyle

## 2017-03-30 NOTE — ED Triage Notes (Signed)
Per EMS, patient comes from home. She has been weak/sick for past 2 days, worse this AM. Started vomiting blood today and had dark blood stools this AM. Last nml BM 2 days ago. Reports palpable LLQ mass, hypoactive BS, no pian. Hypotensive 94/50 on EMS arrival, 150cc bolus given. Last BP 122/60. Pt A&O.

## 2017-03-30 NOTE — ED Notes (Signed)
Patient going to go for EGD. Report given.

## 2017-03-30 NOTE — Interval H&P Note (Signed)
History and Physical Interval Note:  03/30/2017 1:05 PM  Kendra White  has presented today for surgery, with the diagnosis of Hematemesis, anemia  The various methods of treatment have been discussed with the patient and family. After consideration of risks, benefits and other options for treatment, the patient has consented to  Procedure(s): ESOPHAGOGASTRODUODENOSCOPY (EGD) WITH PROPOFOL (N/A) as a surgical intervention .  The patient's history has been reviewed, patient examined, no change in status, stable for surgery.  I have reviewed the patient's chart and labs.  Questions were answered to the patient's satisfaction.     Charlie PitterHenry L Danis III

## 2017-03-30 NOTE — Consult Note (Signed)
Consultation  Referring Provider: Dr. Adela Lank     Primary Care Physician:  Samuel Jester, DO Primary Gastroenterologist: Dr. Juanda Chance        Reason for Consultation: Hematemesis             HPI:   Kendra White is a 83 y.o. female with a past medical history of coronary artery disease, diabetes, TIA and multiple others listed below, who presented to the ER today with a complaint of diarrhea and hematemesis.   Today, the patient explains that she started with loose stools yesterday which sent her to the bathroom multiple times. This continued into the night and around 3 AM she woke up with worsened abdominal pain and loose stool. She woke up again at 8 AM and felt as though she needed to have another bowel movement, but also had an onset of worsened nausea and started vomiting. Per her recollection she filled the bottom of a trashcan with "dark red clots of blood" and continued with diarrhea which was now "much darker/black" in appearance. She also noticed some bright red blood in the toilet bowl. She tried to shower to clean herself but had another episode of vomiting of dark red clots. She then called EMS and presented to the hospital. Associated symptoms included diaphoresis, dizziness and weakness as well as some abdominal pain.   Patient's history is positive for vague history of ulcer "back when I had my kids", patient never had an endoscopy. Patient does use Celebrex at least twice a week as well as a daily 81 mg aspirin. Family history positive for colon cancer in her father.   Patient denies fever, chills, weight loss, syncope, heartburn or reflux.  Previous GI history: 12/06/11-Colonoscopy, Dr. Juanda Chance: for family h/o colon cancer, normal, no repeat needed  Past Medical History:  Diagnosis Date  . Acute renal insufficiency 09/16/2016  . Anxiety   . Blood transfusion   . Coronary artery disease due to lipid rich plaque 10/27/2016  . Dependent edema   . Diabetes mellitus   . Essential  hypertension 08/12/2016  . Headache(784.0)   . Hx-TIA (transient ischemic attack)   . Hypercholesteremia   . Moderate episode of recurrent major depressive disorder (HCC) 12/07/2016  . Sciatica, left side 08/12/2016  . Swelling of ankle    and abdomen  . Ulcer   . Vascular dementia without behavioral disturbance 11/11/2016    Past Surgical History:  Procedure Laterality Date  . ABDOMINAL HYSTERECTOMY    . COLONOSCOPY    . DILATION AND CURETTAGE OF UTERUS    . LAPAROSCOPIC APPENDECTOMY  02/07/2011   Procedure: APPENDECTOMY LAPAROSCOPIC;  Surgeon: Dalia Heading;  Location: [facility];  Service: General;  Laterality: N/A;  . POLYPECTOMY      Family History  Problem Relation Age of Onset  . Colon cancer Father   . Colon polyps Maternal Aunt   . Pancreatic cancer Brother      Social History  Substance Use Topics  . Smoking status: Never Smoker  . Smokeless tobacco: Never Used  . Alcohol use No    Prior to Admission medications   Medication Sig Start Date End Date Taking? Authorizing Provider  acetaminophen (TYLENOL) 325 MG tablet Take 325-650 mg by mouth daily as needed for mild pain or moderate pain. For pain     [provider]  ALPRAZolam (XANAX) 0.25 MG tablet Take 0.125-0.25 mg by mouth 3 (three) times daily as needed for anxiety.  [provider]  aspirin 81 MG EC tablet Take 81 mg by mouth daily.      [provider]  Biotin 5000 MCG CAPS Take by mouth daily.    [provider]  citalopram (CELEXA) 20 MG tablet Take 1 tablet (20 mg total) by mouth daily. 12/29/16   Neysa Hotter, MD  fluticasone (FLONASE) 50 MCG/ACT nasal spray Place 2 sprays into both nostrils daily. 09/22/16 10/02/16  Filbert Schilder, MD  furosemide (LASIX) 40 MG tablet Take 0.5 tablets (20 mg total) by mouth every morning. 09/21/16   Filbert Schilder, MD  Garlic 1000 MG CAPS Take 1,000 mg by mouth daily.     [provider]  loratadine (CLARITIN) 10 MG  tablet Take 1 tablet (10 mg total) by mouth daily. 09/22/16   Filbert Schilder, MD  metFORMIN (GLUCOPHAGE) 500 MG tablet Take 500 mg by mouth at bedtime.     [provider]  Multiple Vitamins-Minerals (MULTIVITAMIN WITH MINERALS) tablet Take 1 tablet by mouth daily.      [provider]  primidone (MYSOLINE) 50 MG tablet Take 50 mg by mouth 2 (two) times daily.    [provider]  propranolol (INDERAL) 40 MG tablet Take 40 mg by mouth daily.  09/05/16   [provider]  rosuvastatin (CRESTOR) 10 MG tablet Take 1 tablet (10 mg total) by mouth daily at 6 PM. 09/21/16   Filbert Schilder, MD    Current Facility-Administered Medications  Medication Dose Route Frequency Provider Last Rate Last Dose  . 0.9 %  sodium chloride infusion   Intravenous Continuous Unk Lightning, [place]      . pantoprazole (PROTONIX) 80 mg in sodium chloride 0.9 % 250 mL (0.32 mg/mL) infusion  8 mg/hr Intravenous Continuous Melene Plan, DO 25 mL/hr at 03/30/17 1125 8 mg/hr at 03/30/17 1125   Current Outpatient Prescriptions  Medication Sig Dispense Refill  . acetaminophen (TYLENOL) 325 MG tablet Take 325-650 mg by mouth daily as needed for mild pain or moderate pain. For pain     . ALPRAZolam (XANAX) 0.25 MG tablet Take 0.125-0.25 mg by mouth 3 (three) times daily as needed for anxiety.     Marland Kitchen aspirin 81 MG EC tablet Take 81 mg by mouth daily.      . Biotin 5000 MCG CAPS Take by mouth daily.    . citalopram (CELEXA) 20 MG tablet Take 1 tablet (20 mg total) by mouth daily. 30 tablet 1  . fluticasone (FLONASE) 50 MCG/ACT nasal spray Place 2 sprays into both nostrils daily. 1 g 0  . furosemide (LASIX) 40 MG tablet Take 0.5 tablets (20 mg total) by mouth every morning. 30 tablet 0  . Garlic 1000 MG CAPS Take 1,000 mg by mouth daily.     Marland Kitchen loratadine (CLARITIN) 10 MG tablet Take 1 tablet (10 mg total) by mouth daily. 30 tablet 0  . metFORMIN (GLUCOPHAGE) 500 MG tablet Take 500 mg by  mouth at bedtime.     . Multiple Vitamins-Minerals (MULTIVITAMIN WITH MINERALS) tablet Take 1 tablet by mouth daily.      . primidone (MYSOLINE) 50 MG tablet Take 50 mg by mouth 2 (two) times daily.    . propranolol (INDERAL) 40 MG tablet Take 40 mg by mouth daily.     . rosuvastatin (CRESTOR) 10 MG tablet Take 1 tablet (10 mg total) by mouth daily at 6 PM. 30 tablet 0    Allergies as of 03/30/2017 - Review  Complete 03/30/2017  Allergen Reaction Noted  . Atorvastatin  11/11/2016  . Diphenhydramine hcl Rash 02/07/2011  . Rosuvastatin  11/11/2016     Review of Systems:    Constitutional: No weight loss, fever or chills Skin: No rash  Cardiovascular: No chest pain Respiratory: No SOB  Gastrointestinal: See HPI and otherwise negative Genitourinary: No dysuria Neurological: No headache Musculoskeletal: No new muscle or joint pain Hematologic: No bruising Psychiatric: No history of depression or anxiety   Physical Exam:  Vital signs in last 24 hours: Temp:  [98.1 F (36.7 C)-98.7 F (37.1 C)] 98.7 F (37.1 C) (09/13 1221) Pulse Rate:  [80-83] 83 (09/13 1218) Resp:  [16-18] 16 (09/13 1218) BP: (112-136)/(45-68) 136/68 (09/13 1221) SpO2:  [96 %-100 %] 100 % (09/13 1221) Weight:  [169 lb (76.7 kg)] 169 lb (76.7 kg) (09/13 1221)   General:   Pleasant Caucasian female appears to be in NAD, Well developed, Well nourished, alert and cooperative Head:  Normocephalic and atraumatic. Eyes:   PEERL, EOMI. No icterus. Conjunctiva pink. Ears:  Normal auditory acuity. Neck:  Supple Throat: Oral cavity and pharynx without inflammation, swelling or lesion. Black material on tongue and back of mouth Lungs: Respirations even and unlabored. Lungs clear to auscultation bilaterally.   No wheezes, crackles, or rhonchi.  Heart: Normal S1, S2. No MRG. Regular rate and rhythm. No peripheral edema, cyanosis or pallor.  Abdomen:  Soft, nondistended, mild LLQ TTP No rebound or guarding. Normal bowel  sounds. No appreciable masses or hepatomegaly. Rectal:  Not performed.  Msk:  Symmetrical without gross deformities. Peripheral pulses intact.  Extremities:  Without edema, no deformity or joint abnormality.  Neurologic:  Alert and  oriented x4;  grossly normal neurologically.  Skin:   Dry and intact without significant lesions or rashes. Psychiatric: Demonstrates good judgement and reason without abnormal affect or behaviors. Asked a couple of questions over again   LAB RESULTS:  Recent Labs  03/30/17 1133 03/30/17 1142  WBC 7.8  --   HGB 8.8* 8.2*  HCT 25.8* 24.0*  PLT 262  --    BMET  Recent Labs  03/30/17 1133 03/30/17 1142  NA 138 139  K 4.4 4.5  CL 107 104  CO2 27  --   GLUCOSE 167* 158*  BUN 59* 56*  CREATININE 0.97 1.00  CALCIUM 8.5*  --    LFT  Recent Labs  03/30/17 1133  PROT 5.9*  ALBUMIN 3.0*  AST 21  ALT 20  ALKPHOS 41  BILITOT 0.4     Impression / Plan:   Impression: 1. Hematemesis: Started this morning with 2 episodes of emesis with "a lot of bloody clots", hemoglobin now 8.2, use of Celebrex and aspirin; concern for ulcer versus other 2. Anemia: See above 3. Melena  Plan: 1. Patient is scheduled for an EGD with Dr. Myrtie Neither in an hour. Did discuss risks, benefits, limitations and alternatives and the patient agrees to proceed. 2. Patient to remain nothing by mouth until after time of procedure, further recommendations at that time 3. Continue to monitor hemoglobin with transfusion less than 7 4. Continue supportive measures 5. Please await further recommendations after EGD  Thank you for your kind consultation, we will continue to follow.  Violet Baldy Lemmon  03/30/2017, 12:25 PM Pager #: [phone]   I have reviewed the entire case in detail with the above APP and discussed the plan in detail.  The PA acted as my scribe. therefore I agree with the diagnoses recorded  above.   I saw the patient again in the pre-op area with her  daughter and got the additional history of regular Aleve use.  My additional thoughts are as follows:  Probable GU/DU with bleeding.  Currently stable.  EGD to localize and,if needed, apply endoscopic therapy.   The benefits and risks of the planned procedure were described in detail with the patient or (when appropriate) their health care proxy.  Risks were outlined as including, but not limited to, bleeding, infection, perforation, adverse medication reaction leading to cardiac or pulmonary decompensation, or pancreatitis (if ERCP).  The limitation of incomplete mucosal visualization was also discussed.  No guarantees or warranties were given.  Patient at increased risk for cardiopulmonary complications of procedure due to age and medical comorbidities.   Charlie PitterHenry L Danis III Pager [phone]  Mon-Fri 8a-5p [phone] after 5p, weekends, holidays

## 2017-03-30 NOTE — ED Provider Notes (Signed)
[facility]-EMERGENCY DEPT Provider Note   CSN: [id number] Arrival date & time: 03/30/17  4098   History   Chief Complaint Chief Complaint  Patient presents with  . Emesis    Blood    HPI Kendra White is a 83 y.o. female.  Patient with PMH of CVA, HTN, DM, and arthritis who is presenting after acute onset bloody emesis that started around 8:00 this morning. Patient reports 2 days of greater than 5 loose, brown bowel movements per day and non specific abdominal pain leading up to her current presentation. Around 3:00 am on the morning of presentation the patient woke up with worsening abdominal pain and loose stool. She went back to sleep and when she woke up at 8:00 am she felt worsening nausea and abdominal pain. She thought she was going to have another episode of diarrhea, but began vomiting what appeared to be dark red clots of blood. She covered the bottom of her trash can with clots and thought the episode had subsided. She then had an episode of diarrhea, which looked like loose darker stools compared to days prior and noticed some bright red blood in the toilet bowel with this episode. She then got into the shower to clean herself up and had another episode of emesis consisting of dark red clots. After this episode she called EMS. She reports feeling clammy, weak, dizzy, and diaphoretic since all of this started at 8:00 this AM.   She states that she has a remote history of ulcer, but doesn't have a GI doctor and never had an endoscopy. She denies a history of GERD and GERD-like symptoms prior to her episodes of emesis today. She was recently switched from Celebrex to Diclofenac-Misoprostol (75-0.2 mg) BID for management of back pain at a clinic visit in 02/2017 per chart review. The patient states that she does take this medication everyd day as prescribed. She also takes a aspirin 81 mg daily for history of CVA.        Past Medical History:  Diagnosis Date  . Acute renal insufficiency  09/16/2016  . Anxiety   . Blood transfusion   . Coronary artery disease due to lipid rich plaque 10/27/2016  . Dependent edema   . Diabetes mellitus   . Essential hypertension 08/12/2016  . Headache(784.0)   . Hx-TIA (transient ischemic attack)   . Hypercholesteremia   . Moderate episode of recurrent major depressive disorder (HCC) 12/07/2016  . Sciatica, left side 08/12/2016  . Swelling of ankle    and abdomen  . Ulcer   . Vascular dementia without behavioral disturbance 11/11/2016    Patient Active Problem List   Diagnosis Date Noted  . Adjustment disorder with mixed anxiety and depressed mood 12/28/2016  . Major depressive disorder, recurrent episode, moderate (HCC) 12/07/2016  . Hair loss 12/07/2016  . Vascular dementia without behavioral disturbance 11/11/2016  . Dyslipidemia 11/11/2016  . Panic disorder 10/27/2016  . Need for pneumococcal vaccine 10/27/2016  . Anxiety, generalized 10/27/2016  . Coronary artery disease due to lipid rich plaque 10/27/2016  . Acute sinusitis 09/18/2016  . Eye redness 09/18/2016  . Acute renal insufficiency 09/16/2016  . Dehydration 09/16/2016  . Hyponatremia 09/16/2016  . Hypokalemia 09/16/2016  . Cough 09/16/2016  . Diabetes mellitus (HCC)   . Anxiety   . Essential hypertension 08/12/2016  . Sciatica, left side 08/12/2016    Past Surgical History:  Procedure Laterality Date  . ABDOMINAL HYSTERECTOMY    . COLONOSCOPY    .  DILATION AND CURETTAGE OF UTERUS    . LAPAROSCOPIC APPENDECTOMY  02/07/2011   Procedure: APPENDECTOMY LAPAROSCOPIC;  Surgeon: Dalia Heading;  Location: [facility];  Service: General;  Laterality: N/A;  . POLYPECTOMY      OB History    No data available      Home Medications    Prior to Admission medications   Medication Sig Start Date End Date Taking? Authorizing Provider  acetaminophen (TYLENOL) 325 MG tablet Take 325-650 mg by mouth daily as needed for mild pain or moderate pain. For pain     [provider]  ALPRAZolam (XANAX) 0.25 MG tablet Take 0.125-0.25 mg by mouth 3 (three) times daily as needed for anxiety.     [provider]  aspirin 81 MG EC tablet Take 81 mg by mouth daily.      [provider]  Biotin 5000 MCG CAPS Take by mouth daily.    [provider]  citalopram (CELEXA) 20 MG tablet Take 1 tablet (20 mg total) by mouth daily. 12/29/16   Neysa Hotter, MD  fluticasone (FLONASE) 50 MCG/ACT nasal spray Place 2 sprays into both nostrils daily. 09/22/16 10/02/16  Filbert Schilder, MD  furosemide (LASIX) 40 MG tablet Take 0.5 tablets (20 mg total) by mouth every morning. 09/21/16   Filbert Schilder, MD  Garlic 1000 MG CAPS Take 1,000 mg by mouth daily.     [provider]  loratadine (CLARITIN) 10 MG tablet Take 1 tablet (10 mg total) by mouth daily. 09/22/16   Filbert Schilder, MD  metFORMIN (GLUCOPHAGE) 500 MG tablet Take 500 mg by mouth at bedtime.     [provider]  Multiple Vitamins-Minerals (MULTIVITAMIN WITH MINERALS) tablet Take 1 tablet by mouth daily.      [provider]  primidone (MYSOLINE) 50 MG tablet Take 50 mg by mouth 2 (two) times daily.    [provider]  propranolol (INDERAL) 40 MG tablet Take 40 mg by mouth daily.  09/05/16   [provider]  rosuvastatin (CRESTOR) 10 MG tablet Take 1 tablet (10 mg total) by mouth daily at 6 PM. 09/21/16   Filbert Schilder, MD    Family History Family History  Problem Relation Age of Onset  . Colon cancer Father   . Colon polyps Maternal Aunt   . Pancreatic cancer Brother     Social History Social History  Substance Use Topics  . Smoking status: Never Smoker  . Smokeless tobacco: Never Used  . Alcohol use No     Allergies   Atorvastatin; Diphenhydramine hcl; and Rosuvastatin   Review of Systems Review of Systems  Constitutional: Positive for chills and diaphoresis. Negative for fever.  HENT: Negative for congestion,  sinus pain, sinus pressure and sore throat.   Respiratory: Negative for cough and shortness of breath.   Cardiovascular: Positive for chest pain and palpitations.  Gastrointestinal: Positive for abdominal pain, blood in stool, diarrhea, nausea and vomiting.  Genitourinary: Negative for difficulty urinating, flank pain, hematuria and urgency.  Skin: Positive for pallor. Negative for rash.  Neurological: Positive for light-headedness and headaches.   Physical Exam Updated Vital Signs BP (!) 131/46   Pulse 86   Temp 97.6 F (36.4 C) (Oral)   Resp 13   Ht  (1.575 m)   Wt 76.7 kg (169 lb)   SpO2 98%   BMI 30.91 kg/m   Physical Exam  Constitutional: She is oriented to person, place, and time.  Patient appears diaphoretic but is laying comfortably in bed in no acute distress.  HENT:  Mouth/Throat: No oropharyngeal exudate.  Black tint to tongue and black substance present at the corners of her mouth.   Cardiovascular: Normal rate, regular rhythm and intact distal pulses.  Exam reveals no friction rub.   No murmur heard. Pulmonary/Chest: Effort normal. No respiratory distress. She has no wheezes. She has no rales.  Abdominal: Soft. She exhibits no distension and no mass. There is tenderness (with deep palpation of all 4 quadrants). There is no rebound and no guarding.  Musculoskeletal: She exhibits edema (Non pitting edema of ankles, at baseline per patient report). She exhibits no tenderness (with palpation of lower extremiteis).  Neurological: She is alert and oriented to person, place, and time.  PERRL. EOM intact. Face symmetric. Tongue midline. Gross motor and sensation of upper and lower extremities intact bilaterally.  Skin: Skin is warm and dry. Capillary refill takes 2 to 3 seconds. No rash noted. No erythema. There is pallor.   ED Treatments / Results  Labs (all labs ordered are listed, but only abnormal results are displayed) Labs Reviewed  CBC WITH  DIFFERENTIAL/PLATELET - Abnormal; Notable for the following:       Result Value   RBC 2.88 (*)    Hemoglobin 8.8 (*)    HCT 25.8 (*)    All other components within normal limits  COMPREHENSIVE METABOLIC PANEL - Abnormal; Notable for the following:    Glucose, Bld 167 (*)    BUN 59 (*)    Calcium 8.5 (*)    Total Protein 5.9 (*)    Albumin 3.0 (*)    GFR calc non Af Amer 52 (*)    Anion gap 4 (*)    All other components within normal limits  I-STAT CHEM 8, ED - Abnormal; Notable for the following:    BUN 56 (*)    Glucose, Bld 158 (*)    Hemoglobin 8.2 (*)    HCT 24.0 (*)    All other components within normal limits  LIPASE, BLOOD  CBC  CBC  TYPE AND SCREEN  ABO/RH  SURGICAL PATHOLOGY    EKG  EKG Interpretation None      Radiology No results found.  Procedures Procedures (including critical care time)  Medications Ordered in ED Medications  pantoprazole (PROTONIX) 80 mg in sodium chloride 0.9 % 250 mL (0.32 mg/mL) infusion (8 mg/hr Intravenous New Bag/Given 03/30/17 1125)  sodium chloride 0.9 % bolus 1,000 mL (0 mLs Intravenous Stopped 03/30/17 1133)  ondansetron (ZOFRAN) injection 4 mg (4 mg Intravenous Given 03/30/17 1046)  pantoprazole (PROTONIX) 80 mg in sodium chloride 0.9 % 100 mL IVPB (0 mg Intravenous Stopped 03/30/17 1125)  lactated ringers infusion ( Intravenous Anesthesia Volume Adjustment 03/30/17 1330)    Initial Impression / Assessment and Plan / ED Course  I have reviewed the triage vital signs and the nursing notes.  Pertinent labs & imaging results that were available during my care of the patient were reviewed by me and considered in my medical decision making (see chart for details).  Patient reports acute onset of nausea, bloody emesis, and a bloody bowel movement that started this AM. She states that she has never had anything like this happen to her before. Her bloody bowel movement is likely a result of brisk upper GI bleed. Patient has been  taking daily NSAIDs, which is a risk factor for GI ulcer. She initially reported symptoms of hypotension after the  acute vomiting, but her symptoms have improved with IV NS bolus. Patient's vital signs within normal limits and she appears pale, but clinically stable.  Will obtain type and screen, CMP, CBCs, lipase, and Chem 8. She will be started on IV protonix drip, given anti-emetics, and IVF. Her vital signs will be monitored and GI has been consulted at 11:00 am. Will follow up labs and continue to monitor clinical status.   2:10pm Patient returned from EGD procedure with gastroenterology. Hgb before EGD = 8.2. Other labs, including lipase, and electrolytes within normal limits. Will obtain post procedure CBC. GI following with post-procedure recommendations. Will call hospitalist for admission.   Final Clinical Impressions(s) / ED Diagnoses   Final diagnoses:  Acute upper GI bleeding   Admission to monitor clinical status and ensure no repeat bleeding after procedure.  New Prescriptions New Prescriptions   No medications on file     Rozann LeschesNedrud, Ginevra Tacker, MD 03/30/17 1435    Melene PlanFloyd, Dan, DO 03/30/17 1608

## 2017-03-30 NOTE — Anesthesia Preprocedure Evaluation (Signed)
Anesthesia Evaluation  Patient identified by MRN, date of birth, ID band Patient awake    Reviewed: Allergy & Precautions, NPO status , Patient's Chart, lab work & pertinent test results  Airway Mallampati: II  TM Distance: >3 FB Neck ROM: Full    Dental no notable dental hx. (+) Partial Lower, Upper Dentures   Pulmonary neg pulmonary ROS,    Pulmonary exam normal breath sounds clear to auscultation       Cardiovascular hypertension, Pt. on medications Normal cardiovascular exam Rhythm:Regular Rate:Normal     Neuro/Psych negative neurological ROS  negative psych ROS   GI/Hepatic negative GI ROS, Neg liver ROS,   Endo/Other  diabetes, Type 2, Oral Hypoglycemic Agents  Renal/GU negative Renal ROS  negative genitourinary   Musculoskeletal negative musculoskeletal ROS (+)   Abdominal   Peds negative pediatric ROS (+)  Hematology negative hematology ROS (+)   Anesthesia Other Findings   Reproductive/Obstetrics negative OB ROS                             Anesthesia Physical Anesthesia Plan  ASA: III  Anesthesia Plan: General   Post-op Pain Management:    Induction: Intravenous  PONV Risk Score and Plan: 2 and Ondansetron and Dexamethasone  Airway Management Planned: Nasal Cannula  Additional Equipment:   Intra-op Plan:   Post-operative Plan:   Informed Consent: I have reviewed the patients History and Physical, chart, labs and discussed the procedure including the risks, benefits and alternatives for the proposed anesthesia with the patient or authorized representative who has indicated his/her understanding and acceptance.   Dental advisory given  Plan Discussed with: CRNA  Anesthesia Plan Comments:         Anesthesia Quick Evaluation

## 2017-03-30 NOTE — H&P (View-Only) (Signed)
   Consultation  Referring Provider: Dr. Floyd     Primary Care Physician:  Butler, Cynthia, DO Primary Gastroenterologist: Dr. Brodie        Reason for Consultation: Hematemesis             HPI:   Kendra White is a 83 y.o. female with a past medical history of coronary artery disease, diabetes, TIA and multiple others listed below, who presented to the ER today with a complaint of diarrhea and hematemesis.   Today, the patient explains that she started with loose stools yesterday which sent her to the bathroom multiple times. This continued into the night and around 3 AM she woke up with worsened abdominal pain and loose stool. She woke up again at 8 AM and felt as though she needed to have another bowel movement, but also had an onset of worsened nausea and started vomiting. Per her recollection she filled the bottom of a trashcan with "dark red clots of blood" and continued with diarrhea which was now "much darker/black" in appearance. She also noticed some bright red blood in the toilet bowl. She tried to shower to clean herself but had another episode of vomiting of dark red clots. She then called EMS and presented to the hospital. Associated symptoms included diaphoresis, dizziness and weakness as well as some abdominal pain.   Patient's history is positive for vague history of ulcer "back when I had my kids", patient never had an endoscopy. Patient does use Celebrex at least twice a week as well as a daily 81 mg aspirin. Family history positive for colon cancer in her father.   Patient denies fever, chills, weight loss, syncope, heartburn or reflux.  Previous GI history: 12/06/11-Colonoscopy, Dr. Brodie: for family h/o colon cancer, normal, no repeat needed  Past Medical History:  Diagnosis Date  . Acute renal insufficiency 09/16/2016  . Anxiety   . Blood transfusion   . Coronary artery disease due to lipid rich plaque 10/27/2016  . Dependent edema   . Diabetes mellitus   . Essential  hypertension 08/12/2016  . Headache(784.0)   . Hx-TIA (transient ischemic attack)   . Hypercholesteremia   . Moderate episode of recurrent major depressive disorder (HCC) 12/07/2016  . Sciatica, left side 08/12/2016  . Swelling of ankle    and abdomen  . Ulcer   . Vascular dementia without behavioral disturbance 11/11/2016    Past Surgical History:  Procedure Laterality Date  . ABDOMINAL HYSTERECTOMY    . COLONOSCOPY    . DILATION AND CURETTAGE OF UTERUS    . LAPAROSCOPIC APPENDECTOMY  02/07/2011   Procedure: APPENDECTOMY LAPAROSCOPIC;  Surgeon: Mark A Jenkins;  Location: [facility];  Service: General;  Laterality: N/A;  . POLYPECTOMY      Family History  Problem Relation Age of Onset  . Colon cancer Father   . Colon polyps Maternal Aunt   . Pancreatic cancer Brother      Social History  Substance Use Topics  . Smoking status: Never Smoker  . Smokeless tobacco: Never Used  . Alcohol use No    Prior to Admission medications   Medication Sig Start Date End Date Taking? Authorizing Provider  acetaminophen (TYLENOL) 325 MG tablet Take 325-650 mg by mouth daily as needed for mild pain or moderate pain. For pain     [provider]  ALPRAZolam (XANAX) 0.25 MG tablet Take 0.125-0.25 mg by mouth 3 (three) times daily as needed for anxiety.       [provider]  aspirin 81 MG EC tablet Take 81 mg by mouth daily.      [provider]  Biotin 5000 MCG CAPS Take by mouth daily.    [provider]  citalopram (CELEXA) 20 MG tablet Take 1 tablet (20 mg total) by mouth daily. 12/29/16   Hisada, Reina, MD  fluticasone (FLONASE) 50 MCG/ACT nasal spray Place 2 sprays into both nostrils daily. 09/22/16 10/02/16  Kadolph, Alexandria U, MD  furosemide (LASIX) 40 MG tablet Take 0.5 tablets (20 mg total) by mouth every morning. 09/21/16   Kadolph, Alexandria U, MD  Garlic 1000 MG CAPS Take 1,000 mg by mouth daily.     [provider]  loratadine (CLARITIN) 10 MG  tablet Take 1 tablet (10 mg total) by mouth daily. 09/22/16   Kadolph, Alexandria U, MD  metFORMIN (GLUCOPHAGE) 500 MG tablet Take 500 mg by mouth at bedtime.     [provider]  Multiple Vitamins-Minerals (MULTIVITAMIN WITH MINERALS) tablet Take 1 tablet by mouth daily.      [provider]  primidone (MYSOLINE) 50 MG tablet Take 50 mg by mouth 2 (two) times daily.    [provider]  propranolol (INDERAL) 40 MG tablet Take 40 mg by mouth daily.  09/05/16   [provider]  rosuvastatin (CRESTOR) 10 MG tablet Take 1 tablet (10 mg total) by mouth daily at 6 PM. 09/21/16   Kadolph, Alexandria U, MD    Current Facility-Administered Medications  Medication Dose Route Frequency Provider Last Rate Last Dose  . 0.9 %  sodium chloride infusion   Intravenous Continuous Lemmon, Jennifer Lynne, PA      . pantoprazole (PROTONIX) 80 mg in sodium chloride 0.9 % 250 mL (0.32 mg/mL) infusion  8 mg/hr Intravenous Continuous Floyd, Dan, DO 25 mL/hr at 03/30/17 1125 8 mg/hr at 03/30/17 1125   Current Outpatient Prescriptions  Medication Sig Dispense Refill  . acetaminophen (TYLENOL) 325 MG tablet Take 325-650 mg by mouth daily as needed for mild pain or moderate pain. For pain     . ALPRAZolam (XANAX) 0.25 MG tablet Take 0.125-0.25 mg by mouth 3 (three) times daily as needed for anxiety.     . aspirin 81 MG EC tablet Take 81 mg by mouth daily.      . Biotin 5000 MCG CAPS Take by mouth daily.    . citalopram (CELEXA) 20 MG tablet Take 1 tablet (20 mg total) by mouth daily. 30 tablet 1  . fluticasone (FLONASE) 50 MCG/ACT nasal spray Place 2 sprays into both nostrils daily. 1 g 0  . furosemide (LASIX) 40 MG tablet Take 0.5 tablets (20 mg total) by mouth every morning. 30 tablet 0  . Garlic 1000 MG CAPS Take 1,000 mg by mouth daily.     . loratadine (CLARITIN) 10 MG tablet Take 1 tablet (10 mg total) by mouth daily. 30 tablet 0  . metFORMIN (GLUCOPHAGE) 500 MG tablet Take 500 mg by  mouth at bedtime.     . Multiple Vitamins-Minerals (MULTIVITAMIN WITH MINERALS) tablet Take 1 tablet by mouth daily.      . primidone (MYSOLINE) 50 MG tablet Take 50 mg by mouth 2 (two) times daily.    . propranolol (INDERAL) 40 MG tablet Take 40 mg by mouth daily.     . rosuvastatin (CRESTOR) 10 MG tablet Take 1 tablet (10 mg total) by mouth daily at 6 PM. 30 tablet 0    Allergies as of 03/30/2017 - Review   Complete 03/30/2017  Allergen Reaction Noted  . Atorvastatin  11/11/2016  . Diphenhydramine hcl Rash 02/07/2011  . Rosuvastatin  11/11/2016     Review of Systems:    Constitutional: No weight loss, fever or chills Skin: No rash  Cardiovascular: No chest pain Respiratory: No SOB  Gastrointestinal: See HPI and otherwise negative Genitourinary: No dysuria Neurological: No headache Musculoskeletal: No new muscle or joint pain Hematologic: No bruising Psychiatric: No history of depression or anxiety   Physical Exam:  Vital signs in last 24 hours: Temp:  [98.1 F (36.7 C)-98.7 F (37.1 C)] 98.7 F (37.1 C) (09/13 1221) Pulse Rate:  [80-83] 83 (09/13 1218) Resp:  [16-18] 16 (09/13 1218) BP: (112-136)/(45-68) 136/68 (09/13 1221) SpO2:  [96 %-100 %] 100 % (09/13 1221) Weight:  [169 lb (76.7 kg)] 169 lb (76.7 kg) (09/13 1221)   General:   Pleasant Caucasian female appears to be in NAD, Well developed, Well nourished, alert and cooperative Head:  Normocephalic and atraumatic. Eyes:   PEERL, EOMI. No icterus. Conjunctiva pink. Ears:  Normal auditory acuity. Neck:  Supple Throat: Oral cavity and pharynx without inflammation, swelling or lesion. Black material on tongue and back of mouth Lungs: Respirations even and unlabored. Lungs clear to auscultation bilaterally.   No wheezes, crackles, or rhonchi.  Heart: Normal S1, S2. No MRG. Regular rate and rhythm. No peripheral edema, cyanosis or pallor.  Abdomen:  Soft, nondistended, mild LLQ TTP No rebound or guarding. Normal bowel  sounds. No appreciable masses or hepatomegaly. Rectal:  Not performed.  Msk:  Symmetrical without gross deformities. Peripheral pulses intact.  Extremities:  Without edema, no deformity or joint abnormality.  Neurologic:  Alert and  oriented x4;  grossly normal neurologically.  Skin:   Dry and intact without significant lesions or rashes. Psychiatric: Demonstrates good judgement and reason without abnormal affect or behaviors. Asked a couple of questions over again   LAB RESULTS:  Recent Labs  03/30/17 1133 03/30/17 1142  WBC 7.8  --   HGB 8.8* 8.2*  HCT 25.8* 24.0*  PLT 262  --    BMET  Recent Labs  03/30/17 1133 03/30/17 1142  NA 138 139  K 4.4 4.5  CL 107 104  CO2 27  --   GLUCOSE 167* 158*  BUN 59* 56*  CREATININE 0.97 1.00  CALCIUM 8.5*  --    LFT  Recent Labs  03/30/17 1133  PROT 5.9*  ALBUMIN 3.0*  AST 21  ALT 20  ALKPHOS 41  BILITOT 0.4     Impression / Plan:   Impression: 1. Hematemesis: Started this morning with 2 episodes of emesis with "a lot of bloody clots", hemoglobin now 8.2, use of Celebrex and aspirin; concern for ulcer versus other 2. Anemia: See above 3. Melena  Plan: 1. Patient is scheduled for an EGD with Dr. Danis in an hour. Did discuss risks, benefits, limitations and alternatives and the patient agrees to proceed. 2. Patient to remain nothing by mouth until after time of procedure, further recommendations at that time 3. Continue to monitor hemoglobin with transfusion less than 7 4. Continue supportive measures 5. Please await further recommendations after EGD  Thank you for your kind consultation, we will continue to follow.  Jennifer Lynne Lemmon  03/30/2017, 12:25 PM Pager #: [phone]   I have reviewed the entire case in detail with the above APP and discussed the plan in detail.  The PA acted as my scribe. therefore I agree with the diagnoses recorded   above.   I saw the patient again in the pre-op area with her  daughter and got the additional history of regular Aleve use.  My additional thoughts are as follows:  Probable GU/DU with bleeding.  Currently stable.  EGD to localize and,if needed, apply endoscopic therapy.   The benefits and risks of the planned procedure were described in detail with the patient or (when appropriate) their health care proxy.  Risks were outlined as including, but not limited to, bleeding, infection, perforation, adverse medication reaction leading to cardiac or pulmonary decompensation, or pancreatitis (if ERCP).  The limitation of incomplete mucosal visualization was also discussed.  No guarantees or warranties were given.  Patient at increased risk for cardiopulmonary complications of procedure due to age and medical comorbidities.    L Danis III Pager [phone]  Mon-Fri 8a-5p [phone] after 5p, weekends, holidays   

## 2017-03-30 NOTE — H&P (Addendum)
History and Physical  Kendra White ZOX:[id number] DOB: [birth year] DOA: 03/30/2017  PCP: Samuel Jester, DO  Patient coming from: home  Chief Complaint: bleeding  HPI:  24yow PMH DM, CAD presented with hematemesis and dark stools. She underwent urgent EGD which revealed nonbleeding gastric ulcers. She started on PPI infusion and referred for admission.  Patient reports for the last week she's had normal bowel movements alternating with constipation. Last evening she developed nausea and vomited dark, tarry material and passed some red blood from her rectum. No abdominal pain.  ED Course: Afebrile, vital signs stable. Underwent urgent EGD and started on PPI infusion.  Review of Systems:  Negative for fever, visual changes, sore throat, rash, new muscle aches, chest pain, SOB, dysuria, n/v/abdominal pain.  Past Medical History:  Diagnosis Date  . Acute renal insufficiency 09/16/2016  . Anxiety   . Blood transfusion   . Coronary artery disease due to lipid rich plaque 10/27/2016  . Dependent edema   . Diabetes mellitus   . Essential hypertension 08/12/2016  . Headache(784.0)   . Hx-TIA (transient ischemic attack)   . Hypercholesteremia   . Moderate episode of recurrent major depressive disorder (HCC) 12/07/2016  . Sciatica, left side 08/12/2016  . Swelling of ankle    and abdomen  . Ulcer   . Vascular dementia without behavioral disturbance 11/11/2016    Past Surgical History:  Procedure Laterality Date  . ABDOMINAL HYSTERECTOMY    . COLONOSCOPY    . DILATION AND CURETTAGE OF UTERUS    . LAPAROSCOPIC APPENDECTOMY  02/07/2011   Procedure: APPENDECTOMY LAPAROSCOPIC;  Surgeon: Dalia Heading;  Location: [facility];  Service: General;  Laterality: N/A;  . POLYPECTOMY       reports that she has never smoked. She has never used smokeless tobacco. She reports that she does not drink alcohol or use drugs.   Allergies  Allergen Reactions  . Atorvastatin     Other reaction(s):  Constipation  . Diphenhydramine Hcl Rash  . Rosuvastatin     Other reaction(s): Constipation    Family History  Problem Relation Age of Onset  . Colon cancer Father   . Colon polyps Maternal Aunt   . Pancreatic cancer Brother      Prior to Admission medications   Medication Sig Start Date End Date Taking? Authorizing Provider  acetaminophen (TYLENOL) 325 MG tablet Take 325-650 mg by mouth daily as needed for mild pain or moderate pain. For pain     [provider]  ALPRAZolam (XANAX) 0.25 MG tablet Take 0.125-0.25 mg by mouth 3 (three) times daily as needed for anxiety.     [provider]  aspirin 81 MG EC tablet Take 81 mg by mouth daily.      [provider]  Biotin 5000 MCG CAPS Take by mouth daily.    [provider]  citalopram (CELEXA) 20 MG tablet Take 1 tablet (20 mg total) by mouth daily. 12/29/16   Neysa Hotter, MD  fluticasone (FLONASE) 50 MCG/ACT nasal spray Place 2 sprays into both nostrils daily. 09/22/16 10/02/16  Filbert Schilder, MD  furosemide (LASIX) 40 MG tablet Take 0.5 tablets (20 mg total) by mouth every morning. 09/21/16   Filbert Schilder, MD  Garlic 1000 MG CAPS Take 1,000 mg by mouth daily.     [provider]  loratadine (CLARITIN) 10 MG tablet Take 1 tablet (10 mg total) by mouth daily. 09/22/16   Filbert Schilder, MD  metFORMIN (GLUCOPHAGE) 500  MG tablet Take 500 mg by mouth at bedtime.     [provider]  Multiple Vitamins-Minerals (MULTIVITAMIN WITH MINERALS) tablet Take 1 tablet by mouth daily.      [provider]  primidone (MYSOLINE) 50 MG tablet Take 50 mg by mouth 2 (two) times daily.    [provider]  propranolol (INDERAL) 40 MG tablet Take 40 mg by mouth daily.  09/05/16   [provider]  rosuvastatin (CRESTOR) 10 MG tablet Take 1 tablet (10 mg total) by mouth daily at 6 PM. 09/21/16   Filbert SchilderKadolph, Alexandria U, MD    Physical Exam:   Afebrile, 97.6, 13,  86, 131/46. Appears calm, comfortable.  Eyes. Pupils, irises, lids appear unremarkable.  ENT. Grossly normal hearing, lips, tongue with dark staining.  Respiratory. Clear to auscultation bilaterally. No wheezes, rales or rhonchi. Normal respiratory effort.  Cardiovascular. Regular rate and rhythm. No murmur, rub or gallop. No significant lower extremity edema.  Abdomen appeared obese. Soft, nontender, mild generalized tenderness. No hepatomegaly.  Skin. No rash or induration. Nontender to palpation.  Musculoskeletal. Grossly normal tone and strength all extremities. Digits and nails of the upper extremity is appear unremarkable.  Psychiatric. Grossly normal mood and affect. Speech fluent and appropriate. Judgment and insight appear grossly normal.  Wt Readings from Last 3 Encounters:  03/30/17 76.7 kg (169 lb)  09/16/16 71.7 kg (158 lb)  12/06/11 71.7 kg (158 lb)    I have personally reviewed following labs and imaging studies  Labs:   Basic metabolic panel unremarkable, hepatic function panel unremarkable. Lipase within normal limits.  Hemoglobin stable, 8.3, 8.3. Remainder CBC unremarkable.   Principal Problem:   GIB (gastrointestinal bleeding) Active Problems:   Diabetes mellitus (HCC)   Assessment/Plan Upper GI bleed with probable acute blood loss anemia. Status post urgent endoscopy which revealed nonbleeding gastric ulcers. -Continue to trend hemoglobin, clear liquid diet per GI, continue PPI infusion  Diabetes mellitus 2, stable. -SSI   Severity of Illness: The appropriate patient status for this patient is INPATIENT. Inpatient status is judged to be reasonable and necessary in order to provide the required intensity of service to ensure the patient's safety. The patient's presenting symptoms, physical exam findings, and initial radiographic and laboratory data in the context of their chronic comorbidities is felt to place them at high risk for further clinical  deterioration. Furthermore, it is not anticipated that the patient will be medically stable for discharge from the hospital within 2 midnights of admission. The following factors support the patient status of inpatient.   * I certify that at the point of admission it is my clinical judgment that the patient will require inpatient hospital care spanning beyond 2 midnights from the point of admission due to high intensity of service, high risk for further deterioration and high frequency of surveillance required.*     DVT prophylaxis: SCDs Code Status: DNR Family Communication: none Consults called: GI    Time spent: 60 minutes  Brendia Sacksaniel Goodrich, MD  [facility] Direct contact: [phone] --Via amion app OR  --[email or web address]; password [email or web address]  7PM-7AM contact night coverage as above  03/30/2017, 4:08 PM

## 2017-03-30 NOTE — ED Notes (Signed)
Call report to Danielle at [phone] at [phone]

## 2017-03-30 NOTE — Transfer of Care (Signed)
Immediate Anesthesia Transfer of Care Note  Patient: Kendra White  Procedure(s) Performed: Procedure(s): ESOPHAGOGASTRODUODENOSCOPY (EGD) WITH PROPOFOL (N/A)  Patient Location: PACU and Endoscopy Unit  Anesthesia Type:MAC  Level of Consciousness: awake, alert  and oriented  Airway & Oxygen Therapy: Patient Spontanous Breathing  Post-op Assessment: Report given to RN and Post -op Vital signs reviewed and stable  Post vital signs: Reviewed and stable  Last Vitals:  Vitals:   03/30/17 1218 03/30/17 1221  BP: (!) 122/54 136/68  Pulse: 83 (!) 101  Resp: 16 (!) 25  Temp:  37.1 C  SpO2: 100% 100%    Last Pain:  Vitals:   03/30/17 1221  TempSrc: Oral         Complications: No apparent anesthesia complications

## 2017-03-31 DIAGNOSIS — D62 Acute posthemorrhagic anemia: Secondary | ICD-10-CM | POA: Diagnosis not present

## 2017-03-31 DIAGNOSIS — K259 Gastric ulcer, unspecified as acute or chronic, without hemorrhage or perforation: Secondary | ICD-10-CM | POA: Diagnosis present

## 2017-03-31 DIAGNOSIS — K922 Gastrointestinal hemorrhage, unspecified: Secondary | ICD-10-CM | POA: Diagnosis not present

## 2017-03-31 LAB — BASIC METABOLIC PANEL
ANION GAP: 7 (ref 5–15)
BUN: 43 mg/dL — ABNORMAL HIGH (ref 6–20)
CALCIUM: 8.5 mg/dL — AB (ref 8.9–10.3)
CO2: 24 mmol/L (ref 22–32)
Chloride: 107 mmol/L (ref 101–111)
Creatinine, Ser: 1.05 mg/dL — ABNORMAL HIGH (ref 0.44–1.00)
GFR calc non Af Amer: 47 mL/min — ABNORMAL LOW (ref >60)
GFR, EST AFRICAN AMERICAN: 55 mL/min — AB (ref >60)
GLUCOSE: 137 mg/dL — AB (ref 65–99)
POTASSIUM: 4.2 mmol/L (ref 3.5–5.1)
Sodium: 138 mmol/L (ref 135–145)

## 2017-03-31 LAB — CBC
HEMATOCRIT: 23 % — AB (ref 36.0–46.0)
HEMATOCRIT: 22.3 % — AB (ref 36.0–46.0)
HEMOGLOBIN: 7.9 g/dL — AB (ref 12.0–15.0)
HEMOGLOBIN: 7.7 g/dL — AB (ref 12.0–15.0)
MCH: 31.3 pg (ref 26.0–34.0)
MCH: 31.6 pg (ref 26.0–34.0)
MCHC: 34.3 g/dL (ref 30.0–36.0)
MCHC: 34.5 g/dL (ref 30.0–36.0)
MCV: 91.3 fL (ref 78.0–100.0)
MCV: 91.4 fL (ref 78.0–100.0)
Platelets: 224 10*3/uL (ref 150–400)
Platelets: 224 10*3/uL (ref 150–400)
RBC: 2.52 MIL/uL — AB (ref 3.87–5.11)
RBC: 2.44 MIL/uL — AB (ref 3.87–5.11)
RDW: 14 % (ref 11.5–15.5)
RDW: 13.9 % (ref 11.5–15.5)
WBC: 7.4 10*3/uL (ref 4.0–10.5)
WBC: 6.7 10*3/uL (ref 4.0–10.5)

## 2017-03-31 LAB — GLUCOSE, CAPILLARY
GLUCOSE-CAPILLARY: 150 mg/dL — AB (ref 65–99)
GLUCOSE-CAPILLARY: 219 mg/dL — AB (ref 65–99)

## 2017-03-31 MED ORDER — PANTOPRAZOLE SODIUM 40 MG PO TBEC: 40.0000 mg | DELAYED_RELEASE_TABLET | Freq: Two times a day (BID) | ORAL | 0 refills | Status: DC

## 2017-03-31 NOTE — Care Management Obs Status (Signed)
MEDICARE OBSERVATION STATUS NOTIFICATION   Patient Details  Name: Kendra White MRN: [id number] Date of Birth: 04-28-1931   Medicare Observation Status Notification Given:       Geni Bers, RN 03/31/2017, 2:34 PM

## 2017-03-31 NOTE — Discharge Summary (Signed)
Physician Discharge Summary  Kendra White ZOX:[id number] DOB: 02/18/1931 DOA: 03/30/2017  PCP: Samuel Jester, DO  Admit date: 03/30/2017 Discharge date: 03/31/2017  Admitted From: Home Disposition:  Home  Recommendations for Outpatient Follow-up:  1. Follow up with PCP in 1 week 2. Follow up with  GI 3. Please obtain CBC in 1 week  4. Stop aspirin until further instructed by GI 5. Do not take any NSAIDs, ibuprofen over the counter  6. Please follow up on the following pending results: pathology result from EGD   Discharge Condition: Stable CODE STATUS: DNR  Diet recommendation: Heart healthy   Brief/Interim Summary: From H&P by Dr. Irene Limbo: Kendra White is a 83yo female with PMH DM, CAD presented with hematemesis and dark stools. Patient reports for the last week she's had normal bowel movements alternating with constipation. Last evening she developed nausea and vomited dark, tarry material and passed some red blood from her rectum. No abdominal pain. She underwent urgent EGD on 9/13 which revealed nonbleeding gastric ulcers. She started on PPI infusion and referred for admission. Hgb has remained stable. She will need to stop aspirin, continue PPI, and follow up with PCP for repeat CBC as well as with GI.   Discharge Diagnoses:  Principal Problem:   GIB (gastrointestinal bleeding) Active Problems:   Diabetes mellitus (HCC)  Gastric ulcer with upper GI bleed -S/p EGD 9/13 which showed gastric ulcer, nonbleeding -Hgb stable -Discussed with GI PA this morning, plan to follow up outpatient -No further bleeding episodes -Continue PPI, hold aspirin   DM type 2  -Stable, continue metformin   Depression -Continue celexa   Hypothyroidism -Continue synthroid   HLD -Continue crestor   Essential tremor -Continue primidone   Discharge Instructions  Discharge Instructions    Call MD for:    Complete by:  As directed    Further episodes of bleeding   Call MD  for:  difficulty breathing, headache or visual disturbances    Complete by:  As directed    Call MD for:  extreme fatigue    Complete by:  As directed    Call MD for:  hives    Complete by:  As directed    Call MD for:  persistant dizziness or light-headedness    Complete by:  As directed    Call MD for:  persistant nausea and vomiting    Complete by:  As directed    Call MD for:  severe uncontrolled pain    Complete by:  As directed    Call MD for:  temperature >100.4    Complete by:  As directed    Diet - low sodium heart healthy    Complete by:  As directed    Discharge instructions    Complete by:  As directed    You were cared for by a hospitalist during your hospital stay. If you have any questions about your discharge medications or the care you received while you were in the hospital after you are discharged, you can call the unit and asked to speak with the hospitalist on call if the hospitalist that took care of you is not available. Once you are discharged, your primary care physician will handle any further medical issues. Please note that NO REFILLS for any discharge medications will be authorized once you are discharged, as it is imperative that you return to your primary care physician (or establish a relationship with a primary care physician if you do not have one)  for your aftercare needs so that they can reassess your need for medications and monitor your lab values.   Increase activity slowly    Complete by:  As directed      Allergies as of 03/31/2017      Reactions   Atorvastatin    Other reaction(s): Constipation   Diphenhydramine Hcl Rash   Rosuvastatin    Other reaction(s): Constipation      Medication List    STOP taking these medications   aspirin 81 MG EC tablet   furosemide 40 MG tablet Commonly known as:  LASIX   loratadine 10 MG tablet Commonly known as:  CLARITIN   propranolol 40 MG tablet Commonly known as:  INDERAL     TAKE these  medications   acetaminophen 325 MG tablet Commonly known as:  TYLENOL Take 325-650 mg by mouth daily as needed for mild pain or moderate pain. For pain   ALPRAZolam 0.25 MG tablet Commonly known as:  XANAX Take 0.125-0.25 mg by mouth 3 (three) times daily as needed for anxiety.   Biotin 5000 MCG Caps Take by mouth daily.   citalopram 20 MG tablet Commonly known as:  CELEXA Take 1 tablet (20 mg total) by mouth daily.   levothyroxine 25 MCG tablet Commonly known as:  SYNTHROID, LEVOTHROID Take 75 mcg by mouth daily.   metFORMIN 500 MG tablet Commonly known as:  GLUCOPHAGE Take 500 mg by mouth at bedtime.   multivitamin with minerals tablet Take 1 tablet by mouth daily.   pantoprazole 40 MG tablet Commonly known as:  PROTONIX Take 1 tablet (40 mg total) by mouth 2 (two) times daily before a meal.   primidone 50 MG tablet Commonly known as:  MYSOLINE Take 50 mg by mouth 2 (two) times daily.   rosuvastatin 10 MG tablet Commonly known as:  CRESTOR Take 1 tablet (10 mg total) by mouth daily at 6 PM.            Discharge Care Instructions        Start     Ordered   03/31/17 0000  pantoprazole (PROTONIX) 40 MG tablet  2 times daily before meals     03/31/17 1202   03/31/17 0000  Increase activity slowly     03/31/17 1202   03/31/17 0000  Diet - low sodium heart healthy     03/31/17 1202   03/31/17 0000  Discharge instructions    Comments:  You were cared for by a hospitalist during your hospital stay. If you have any questions about your discharge medications or the care you received while you were in the hospital after you are discharged, you can call the unit and asked to speak with the hospitalist on call if the hospitalist that took care of you is not available. Once you are discharged, your primary care physician will handle any further medical issues. Please note that NO REFILLS for any discharge medications will be authorized once you are discharged, as it is  imperative that you return to your primary care physician (or establish a relationship with a primary care physician if you do not have one) for your aftercare needs so that they can reassess your need for medications and monitor your lab values.   03/31/17 1202   03/31/17 0000  Call MD for:  temperature >100.4     03/31/17 1202   03/31/17 0000  Call MD for:  persistant nausea and vomiting     03/31/17 1202   03/31/17  0000  Call MD for:  severe uncontrolled pain     03/31/17 1202   03/31/17 0000  Call MD for:  extreme fatigue     03/31/17 1202   03/31/17 0000  Call MD for:  persistant dizziness or light-headedness     03/31/17 1202   03/31/17 0000  Call MD for:  hives     03/31/17 1202   03/31/17 0000  Call MD for:  difficulty breathing, headache or visual disturbances     03/31/17 1202   03/31/17 0000  Call MD for:    Comments:  Further episodes of bleeding   03/31/17 1202     Follow-up Information    Samuel Jester, DO. Schedule an appointment as soon as possible for a visit in 1 week(s).   Contact information: [address] [place] [address] [phone]        [facility] Follow up.   Specialty:  Gastroenterology Contact information: [address] [place] [address] [phone]         Allergies  Allergen Reactions  . Atorvastatin     Other reaction(s): Constipation  . Diphenhydramine Hcl Rash  . Rosuvastatin     Other reaction(s): Constipation    Consultations:  GI   Procedures/Studies: No results found.  EGD 9/13 Impression:                - Normal larynx. - Normal esophagus. - Non-bleeding gastric ulcers with pigmented material. Biopsied. - A medium amount of food (residue) in the stomach. - Deformity in the pylorus. - Normal examined duodenum.    Discharge Exam: Vitals:   03/30/17 2048 03/31/17 0408  BP: (!) 130/52 (!) 130/55  Pulse: 88 82  Resp: 18 20  Temp: 98.6 F (37 C) 98.1 F (36.7 C)   SpO2: 97% 100%   Vitals:   03/30/17 1408 03/30/17 1652 03/30/17 2048 03/31/17 0408  BP: (!) 122/51 (!) 133/55 (!) 130/52 (!) 130/55  Pulse: 86 89 88 82  Resp: Temp:  98.8 F (37.1 C) 98.6 F (37 C) 98.1 F (36.7 C)  TempSrc:  Oral Oral Oral  SpO2: 97% 100% 97% 100%  Weight:  80.9 kg (178 lb 5.6 oz)    Height:   (1.575 m)      General: Pt is alert, awake, not in acute distress Cardiovascular: RRR, S1/S2 +, no rubs, no gallops Respiratory: CTA bilaterally, no wheezing, no rhonchi Abdominal: Soft, NT, ND, bowel sounds + Extremities: no edema, no cyanosis    The results of significant diagnostics from this hospitalization (including imaging, microbiology, ancillary and laboratory) are listed below for reference.     Microbiology: No results found for this or any previous visit (from the past 240 hour(s)).   Labs: BNP (last 3 results) No results for input(s): BNP in the last 8760 hours. Basic Metabolic Panel:  Recent Labs Lab 03/30/17 1133 03/30/17 1142 03/31/17 0451  NA 138 139 138  K 4.4 4.5 4.2  CL 107 104 107  CO2 27  --  24  GLUCOSE 167* 158* 137*  BUN 59* 56* 43*  CREATININE 0.97 1.00 1.05*  CALCIUM 8.5*  --  8.5*   Liver Function Tests:  Recent Labs Lab 03/30/17 1133  AST 21  ALT 20  ALKPHOS 41  BILITOT 0.4  PROT 5.9*  ALBUMIN 3.0*    Recent Labs Lab 03/30/17 1133  LIPASE 34   No results for input(s): AMMONIA  in the last 168 hours. CBC:  Recent Labs Lab 03/30/17 1133 03/30/17 1142 03/30/17 1421 03/31/17 0451 03/31/17 0838  WBC 7.8  --  8.5 7.4 6.7  NEUTROABS 5.7  --   --   --   --   HGB 8.8* 8.2* 8.3* 7.9* 7.7*  HCT 25.8* 24.0* 24.3* 23.0* 22.3*  MCV 89.6  --  89.3 91.3 91.4  PLT 262  --  257 224 224   Cardiac Enzymes: No results for input(s): CKTOTAL, CKMB, CKMBINDEX, TROPONINI in the last 168 hours. BNP: Invalid input(s): POCBNP CBG:  Recent Labs Lab 03/30/17 1739 03/30/17 2129 03/31/17 0737  03/31/17 1214  GLUCAP 188* 136* 150* 219*   D-Dimer No results for input(s): DDIMER in the last 72 hours. Hgb A1c No results for input(s): HGBA1C in the last 72 hours. Lipid Profile No results for input(s): CHOL, HDL, LDLCALC, TRIG, CHOLHDL, LDLDIRECT in the last 72 hours. Thyroid function studies No results for input(s): TSH, T4TOTAL, T3FREE, THYROIDAB in the last 72 hours.  Invalid input(s): FREET3 Anemia work up No results for input(s): VITAMINB12, FOLATE, FERRITIN, TIBC, IRON, RETICCTPCT in the last 72 hours. Urinalysis    Component Value Date/Time   COLORURINE YELLOW 09/16/2016 1623   APPEARANCEUR HAZY (A) 09/16/2016 1623   LABSPEC 1.014 09/16/2016 1623   PHURINE 5.0 09/16/2016 1623   GLUCOSEU NEGATIVE 09/16/2016 1623   HGBUR NEGATIVE 09/16/2016 1623   BILIRUBINUR NEGATIVE 09/16/2016 1623   KETONESUR NEGATIVE 09/16/2016 1623   PROTEINUR NEGATIVE 09/16/2016 1623   UROBILINOGEN 1.0 02/07/2011 1207   NITRITE NEGATIVE 09/16/2016 1623   LEUKOCYTESUR NEGATIVE 09/16/2016 1623   Sepsis Labs Invalid input(s): PROCALCITONIN,  WBC,  LACTICIDVEN Microbiology No results found for this or any previous visit (from the past 240 hour(s)).   Time coordinating discharge: 40 minutes  SIGNED:  Noralee Stain, DO [facility] Pager [phone]  If 7PM-7AM, please contact night-coverage [email or web address] Password [email or web address] 03/31/2017, 1:49 PM

## 2017-03-31 NOTE — Care Management CC44 (Signed)
Condition Code 44 Documentation Completed  Patient Details  Name: Kendra White MRN: [id number] Date of Birth: Dec 11, 1930   Condition Code 44 given:  Yes Patient signature on Condition Code 44 notice:  Yes Documentation of 2 MD's agreement:  Yes Code 44 added to claim:  Yes    Geni Bers, RN 03/31/2017, 2:34 PM

## 2017-03-31 NOTE — Progress Notes (Signed)
Went over discharge paperwork with patient and family.  All questions answered.  Prescriptions and AVS given to daughter.  VSS.

## 2017-04-04 ENCOUNTER — Other Ambulatory Visit: Payer: Self-pay

## 2017-04-04 MED ORDER — DOXYCYCLINE HYCLATE 100 MG PO CAPS: 100.0000 mg | ORAL_CAPSULE | Freq: Two times a day (BID) | ORAL | 0 refills | Status: DC

## 2017-04-04 MED ORDER — BISMUTH 262 MG PO CHEW: CHEWABLE_TABLET | ORAL | 0 refills | Status: DC

## 2017-04-04 MED ORDER — METRONIDAZOLE 250 MG PO TABS: 250.0000 mg | ORAL_TABLET | Freq: Four times a day (QID) | ORAL | 0 refills | Status: DC

## 2017-05-01 ENCOUNTER — Encounter (INDEPENDENT_AMBULATORY_CARE_PROVIDER_SITE_OTHER): Payer: Self-pay

## 2017-05-01 ENCOUNTER — Ambulatory Visit (INDEPENDENT_AMBULATORY_CARE_PROVIDER_SITE_OTHER): Payer: Medicare Other | Admitting: Gastroenterology

## 2017-05-01 ENCOUNTER — Encounter: Payer: Self-pay | Admitting: Gastroenterology

## 2017-05-01 VITALS — BP 138/62 | HR 72 | Ht 62.0 in | Wt 171.4 lb

## 2017-05-01 DIAGNOSIS — A048 Other specified bacterial intestinal infections: Secondary | ICD-10-CM | POA: Diagnosis not present

## 2017-05-01 DIAGNOSIS — D62 Acute posthemorrhagic anemia: Secondary | ICD-10-CM | POA: Diagnosis not present

## 2017-05-01 DIAGNOSIS — K254 Chronic or unspecified gastric ulcer with hemorrhage: Secondary | ICD-10-CM | POA: Diagnosis not present

## 2017-05-01 NOTE — Patient Instructions (Signed)
If you are age 81 or older, your body mass index should be between 23-30. Your Body mass index is 31.34 kg/m. If this is out of the aforementioned range listed, please consider follow up with your Primary Care Provider.  If you are age 32 or younger, your body mass index should be between 19-25. Your Body mass index is 31.34 kg/m. If this is out of the aformentioned range listed, please consider follow up with your Primary Care Provider.   You have been scheduled for an endoscopy. Please follow written instructions given to you at your visit today. If you use inhalers (even only as needed), please bring them with you on the day of your procedure. Your physician has requested that you go to www.startemmi.com and enter the access code given to you at your visit today. This web site gives a general overview about your procedure. However, you should still follow specific instructions given to you by our office regarding your preparation for the procedure.  Thank you for choosing [facility]  Dr Amada Jupiter III

## 2017-05-01 NOTE — Progress Notes (Signed)
[facility] Progress Note  Chief Complaint: Gastric ulcer  Subjective  History:  This is an 83 year old woman I saw month ago she was hospitalized for melena and acute blood loss anemia. Upper endoscopy revealed multiple gastric and duodenal ulcers. This appeared likely related to chronic use of Naprosyn. Gastric biopsies came back positive for H. pylori, and she underwent with bismuth, doxycycline and metronidazole with twice daily PPI. She finished that treatment over a week ago, and has also been off aspirin since hospital discharge. Dayton Scrape is here with her daughter Alona Bene today, and she has not had any further dark stools since stopping the bismuth. She denies abdominal pain or hematemesis. Her appetite is good and her weight stable. She mainly complains of fatigue. Dayton Scrape was advised to start iron tablets after biopsy results were reviewed, but she decided not to do that out of concern it might upset her stomach.  ROS: Cardiovascular:  no chest pain Respiratory: no dyspnea  The patient's Past Medical, Family and Social History were reviewed and are on file in the EMR.  Objective:  Med list reviewed  Current Outpatient Prescriptions:  .  acetaminophen (TYLENOL) 325 MG tablet, Take 325-650 mg by mouth daily as needed for mild pain or moderate pain. For pain , Disp: , Rfl:  .  ALPRAZolam (XANAX) 0.25 MG tablet, Take 0.125-0.25 mg by mouth 3 (three) times daily as needed for anxiety. , Disp: , Rfl:  .  Biotin 5000 MCG CAPS, Take by mouth daily., Disp: , Rfl:  .  citalopram (CELEXA) 20 MG tablet, Take 1 tablet (20 mg total) by mouth daily., Disp: 30 tablet, Rfl: 1 .  levothyroxine (SYNTHROID, LEVOTHROID) 25 MCG tablet, Take 75 mcg by mouth daily., Disp: , Rfl:  .  metFORMIN (GLUCOPHAGE) 500 MG tablet, Take 500 mg by mouth at bedtime. , Disp: , Rfl:  .  Multiple Vitamins-Minerals (MULTIVITAMIN WITH MINERALS) tablet, Take 1 tablet by mouth daily.  , Disp: , Rfl:  .  primidone  (MYSOLINE) 50 MG tablet, Take 50 mg by mouth 2 (two) times daily., Disp: , Rfl:  .  rosuvastatin (CRESTOR) 10 MG tablet, Take 1 tablet (10 mg total) by mouth daily at 6 PM., Disp: 30 tablet, Rfl: 0 .  pantoprazole (PROTONIX) 40 MG tablet, Take 1 tablet (40 mg total) by mouth 2 (two) times daily before a meal., Disp: 60 tablet, Rfl: 0   Vital signs in last 24 hrs: Vitals:   05/01/17 1318  BP: 138/62  Pulse: 72    Physical Exam    HEENT: sclera anicteric, oral mucosa moist without lesions  Neck: supple, no thyromegaly, JVD or lymphadenopathy  Cardiac: RRR without murmurs, S1S2 heard, no peripheral edema  Pulm: clear to auscultation bilaterally, normal RR and effort noted  Abdomen: soft, no tenderness, with active bowel sounds. No guarding or palpable hepatosplenomegaly.  Skin; warm and dry, no jaundice or rash  Recent Labs:  Hemoglobin 8.3 on 04/12/2017 at a primary care visit   @ Assessment: Encounter Diagnoses  Name Primary?  . Chronic gastric ulcer with hemorrhage Yes  . H. pylori infection   . Acute blood loss anemia     Her ulcer appears to have been largely due to aspirin and NSAID use, with some component of H. pylori.  Plan: Continue PPI Strongly encouraged to start once daily slow release iron and follow-up as scheduled with primary care to check blood counts and manage iron accordingly. Repeat EGD in 3-4 weeks to confirm  ulcer healing and biopsy to confirm H. pylori eradication.   Total time 30 minutes, over half spent in counseling and coordination of care.   Charlie Pitter III

## 2017-05-18 ENCOUNTER — Other Ambulatory Visit: Payer: Self-pay

## 2017-05-29 ENCOUNTER — Encounter: Payer: Self-pay | Admitting: Gastroenterology

## 2017-05-29 ENCOUNTER — Ambulatory Visit (AMBULATORY_SURGERY_CENTER): Payer: Medicare Other | Admitting: Gastroenterology

## 2017-05-29 ENCOUNTER — Other Ambulatory Visit: Payer: Self-pay

## 2017-05-29 VITALS — BP 162/67 | HR 68 | Temp 96.6°F | Resp 16 | Ht 62.0 in | Wt 171.0 lb

## 2017-05-29 DIAGNOSIS — K250 Acute gastric ulcer with hemorrhage: Secondary | ICD-10-CM | POA: Diagnosis not present

## 2017-05-29 DIAGNOSIS — K269 Duodenal ulcer, unspecified as acute or chronic, without hemorrhage or perforation: Secondary | ICD-10-CM

## 2017-05-29 DIAGNOSIS — K254 Chronic or unspecified gastric ulcer with hemorrhage: Secondary | ICD-10-CM

## 2017-05-29 DIAGNOSIS — Z8719 Personal history of other diseases of the digestive system: Secondary | ICD-10-CM

## 2017-05-29 DIAGNOSIS — K2950 Unspecified chronic gastritis without bleeding: Secondary | ICD-10-CM | POA: Diagnosis not present

## 2017-05-29 MED ORDER — SODIUM CHLORIDE 0.9 % IV SOLN: 500.0000 mL | INTRAVENOUS | Status: DC

## 2017-05-29 NOTE — Progress Notes (Signed)
Pt. Reports no change in her medical or surgical history since her pre-visit 05/01/2017. 

## 2017-05-29 NOTE — Op Note (Signed)
[facility] Patient Name: Andres Escandon Procedure Date: 05/29/2017 10:06 AM MRN: [id number] Endoscopist: Sherilyn Cooter L. Myrtie Neither , MD Age: 83 Referring MD:  Date of Birth: February 13, 1931 Gender: Female Account #: [id number] Procedure:                Upper GI endoscopy Indications:              Previously treated for Helicobacter pylori,                            Follow-up of chronic gastric ulcer with hemorrhage,                            Follow-up of chronic duodenal ulcer with hemorrhage Medicines:                Monitored Anesthesia Care Procedure:                Pre-Anesthesia Assessment:                           - Prior to the procedure, a History and Physical                            was performed, and patient medications and                            allergies were reviewed. The patient's tolerance of                            previous anesthesia was also reviewed. The risks                            and benefits of the procedure and the sedation                            options and risks were discussed with the patient.                            All questions were answered, and informed consent                            was obtained. Prior Anticoagulants: The patient has                            taken no previous anticoagulant or antiplatelet                            agents. ASA Grade Assessment: II - A patient with                            mild systemic disease. After reviewing the risks                            and benefits, the patient was deemed in  satisfactory condition to undergo the procedure.                           After obtaining informed consent, the endoscope was                            passed under direct vision. Throughout the                            procedure, the patient's blood pressure, pulse, and                            oxygen saturations were monitored continuously. The   Endoscope was introduced through the mouth, and                            advanced to the second part of duodenum. The upper                            GI endoscopy was accomplished without difficulty.                            The patient tolerated the procedure well. Scope In: Scope Out: Findings:                 The larynx was normal.                           The esophagus was normal.                           The stomach was normal. Biopsies were taken with a                            cold forceps for histology. (Sidney protocol). The                            previously-seen ulcer was healed.                           The cardia and gastric fundus were normal on                            retroflexion.                           The examined duodenum was normal. The                            previously-seen ulcer was healed. Complications:            No immediate complications. Estimated Blood Loss:     Estimated blood loss was minimal. Impression:               - Normal larynx.                           -  Normal esophagus.                           - Normal stomach. Biopsied.                           - Normal examined duodenum. Recommendation:           - Patient has a contact number available for                            emergencies. The signs and symptoms of potential                            delayed complications were discussed with the                            patient. Return to normal activities tomorrow.                            Written discharge instructions were provided to the                            patient.                           - Resume previous diet.                           - Continue present medications. If still taking                            pantoprazole or other PPI medicine, it can be                            discontinued.                           - Avoid NSAIDs (because they contributed to the                            ulcers)                            - Await pathology results. Abdulkadir Emmanuel L. Myrtie Neitheranis, MD 05/29/2017 10:27:04 AM This report has been signed electronically.

## 2017-05-29 NOTE — Progress Notes (Signed)
Called to room to assist during endoscopic procedure.  Patient ID and intended procedure confirmed with present staff. Received instructions for my participation in the procedure from the performing physician.  

## 2017-05-29 NOTE — Patient Instructions (Signed)
YOU HAD AN ENDOSCOPIC PROCEDURE TODAY AT THE [facility]:   Refer to the procedure report that was given to you for any specific questions about what was found during the examination.  If the procedure report does not answer your questions, please call your gastroenterologist to clarify.  If you requested that your care partner not be given the details of your procedure findings, then the procedure report has been included in a sealed envelope for you to review at your convenience later.  YOU SHOULD EXPECT: Some feelings of bloating in the abdomen. Passage of more gas than usual.  Walking can help get rid of the air that was put into your GI tract during the procedure and reduce the bloating. If you had a lower endoscopy (such as a colonoscopy or flexible sigmoidoscopy) you may notice spotting of blood in your stool or on the toilet paper. If you underwent a bowel prep for your procedure, you may not have a normal bowel movement for a few days.  Please Note:  You might notice some irritation and congestion in your nose or some drainage.  This is from the oxygen used during your procedure.  There is no need for concern and it should clear up in a day or so.  SYMPTOMS TO REPORT IMMEDIATELY:   Following upper endoscopy (EGD)  Vomiting of blood or coffee ground material  New chest pain or pain under the shoulder blades  Painful or persistently difficult swallowing  New shortness of breath  Fever of 100F or higher  Black, tarry-looking stools  For urgent or emergent issues, a gastroenterologist can be reached at any hour by calling [phone].   DIET:  We do recommend a small meal at first, but then you may proceed to your regular diet.  Drink plenty of fluids but you should avoid alcoholic beverages for 24 hours.  ACTIVITY:  You should plan to take it easy for the rest of today and you should NOT DRIVE or use heavy machinery until tomorrow (because of the sedation medicines used  during the test).    FOLLOW UP: Our staff will call the number listed on your records the next business day following your procedure to check on you and address any questions or concerns that you may have regarding the information given to you following your procedure. If we do not reach you, we will leave a message.  However, if you are feeling well and you are not experiencing any problems, there is no need to return our call.  We will assume that you have returned to your regular daily activities without incident.  If any biopsies were taken you will be contacted by phone or by letter within the next 1-3 weeks.  Please call us at [phone] if you have not heard about the biopsies in 3 weeks.   Await for biopsy results Avoid NSAIDS because they contributed to the ulcers  SIGNATURES/CONFIDENTIALITY: You and/or your care partner have signed paperwork which will be entered into your electronic medical record.  These signatures attest to the fact that that the information above on your After Visit Summary has been reviewed and is understood.  Full responsibility of the confidentiality of this discharge information lies with you and/or your care-partner.

## 2017-05-29 NOTE — Progress Notes (Signed)
A/ox3 pleased with MAC, report to [facility]

## 2017-05-30 ENCOUNTER — Telehealth: Payer: Self-pay | Admitting: *Deleted

## 2017-05-30 NOTE — Telephone Encounter (Signed)
  Follow up Call-  Call back number 05/29/2017  Post procedure Call Back phone  # [phone]  Permission to leave phone message Yes  Some recent data might be hidden     Patient questions:  Do you have a fever, pain , or abdominal swelling? No. Pain Score  0 *  Have you tolerated food without any problems? Yes.    Have you been able to return to your normal activities? Yes.    Do you have any questions about your discharge instructions: Diet   No. Medications  No. Follow up visit  No.  Do you have questions or concerns about your Care? No.  Actions: * If pain score is 4 or above: No action needed, pain <4.

## 2017-08-17 IMAGING — CT CT MAXILLOFACIAL W/O CM
3 of 4 series · 15 of 47 positions shown, 18 images · non-contrast
Comparison: None.

CLINICAL DATA: Left-sided face pain. Cough and congestion for 2
weeks

EXAM:
CT MAXILLOFACIAL WITHOUT CONTRAST
TECHNIQUE: Multidetector CT imaging of the maxillofacial structures was
performed. Multiplanar CT image reconstructions were also generated.
A small metallic BB was placed on the right temple in order to
reliably differentiate right from left.

[Series 2: max soft · axial · 0.34mm/px · z∈[+957,+1099]mm · 10 of 83 slices shown, 13 images]
[im 6/83  brain]
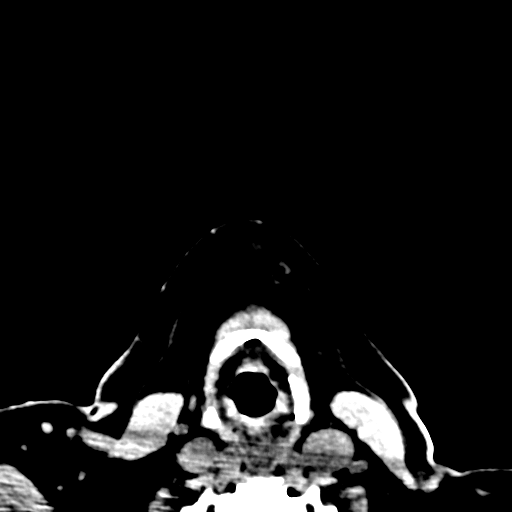
[im 6/83  bone]
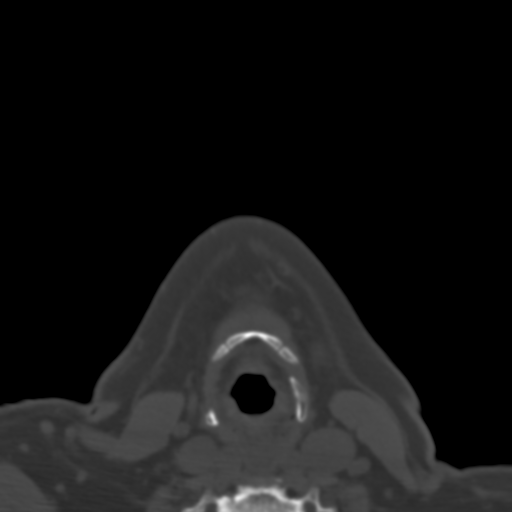
[im 15/83  bone]
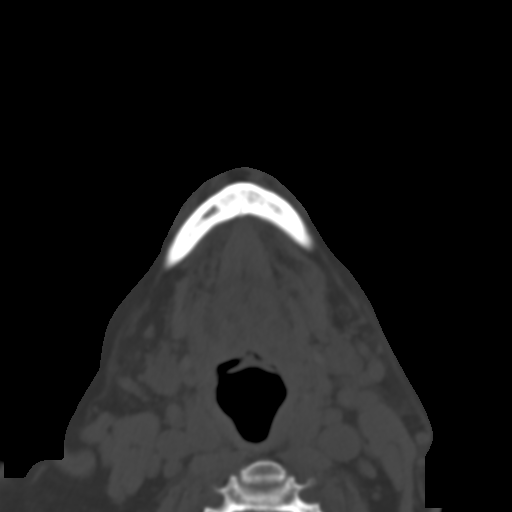
[im 23/83  bone]
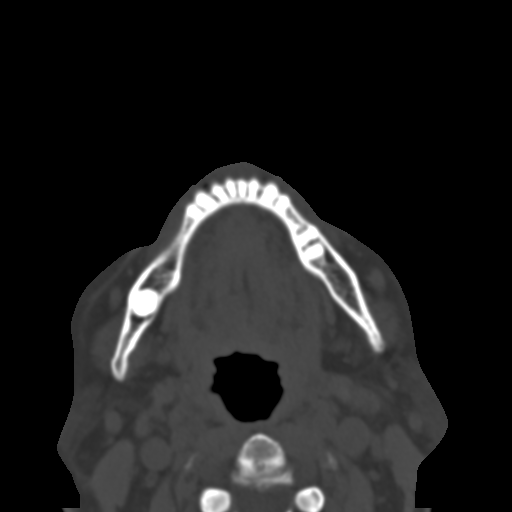
[im 29/83  bone]
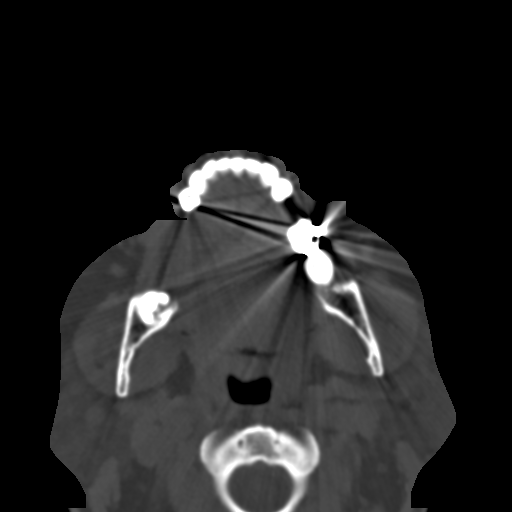
[im 37/83  brain]
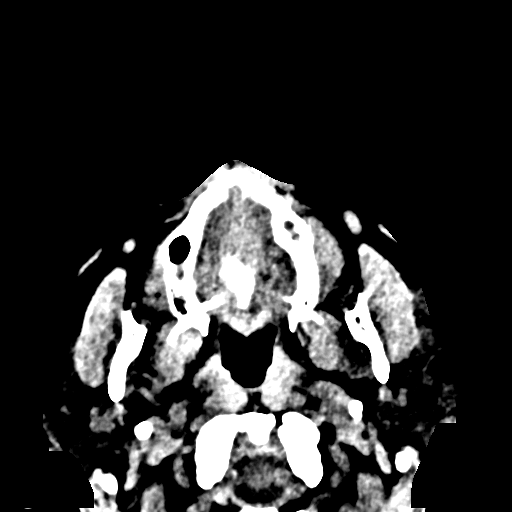
[im 37/83  bone]
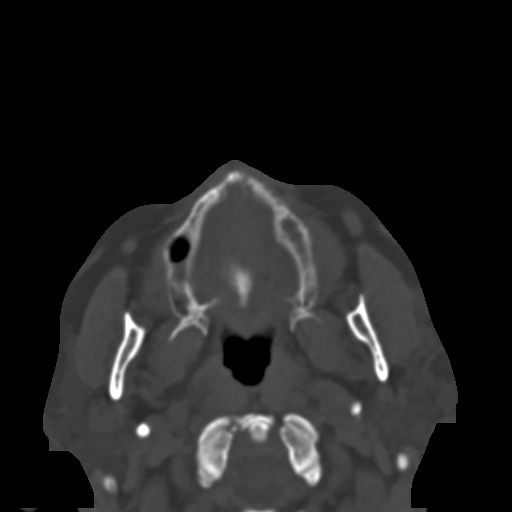
[im 46/83  bone]
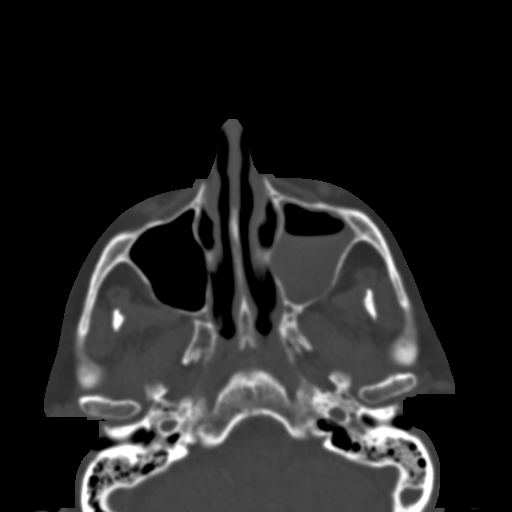
[im 54/83  bone]
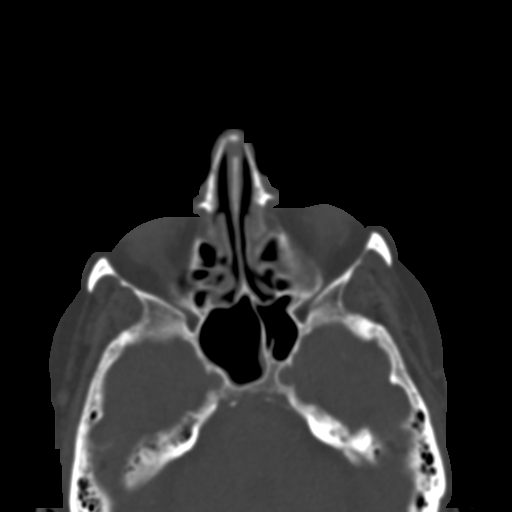
[im 63/83  bone]
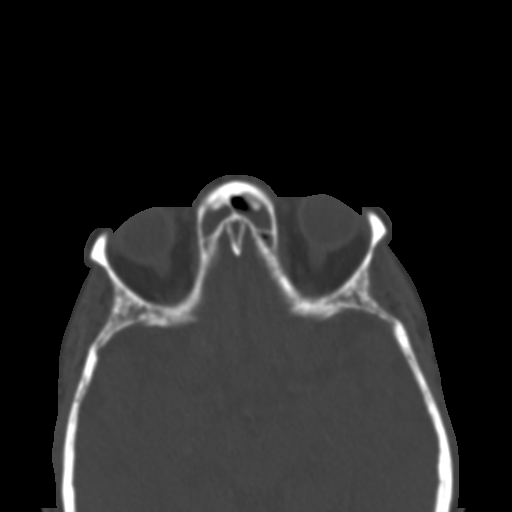
[im 68/83  brain]
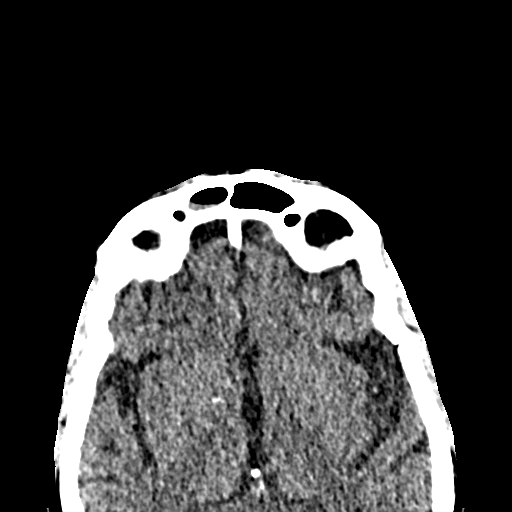
[im 68/83  bone]
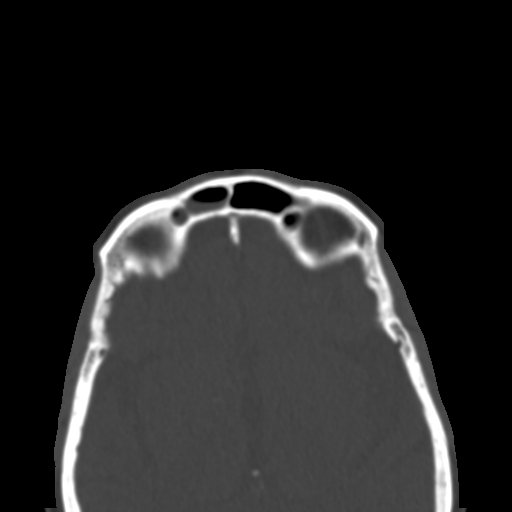
[im 77/83  bone]
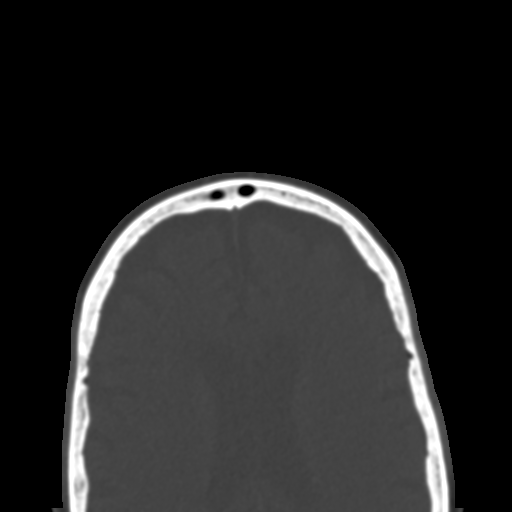

[Series 6: coronal soft · coronal · 0.34mm/px · 3 of 71 slices shown]
[im 24/71  bone]
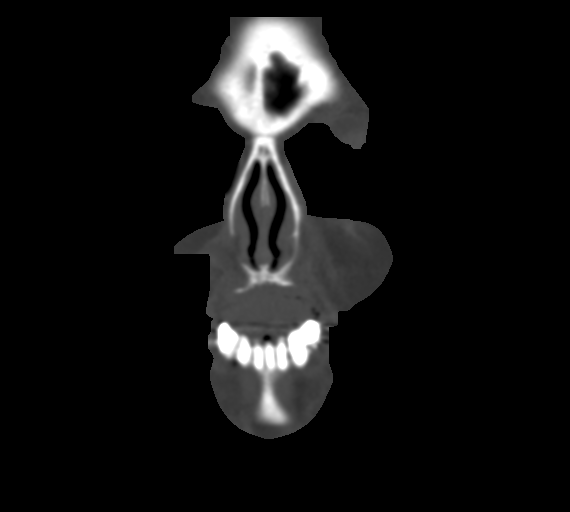
[im 32/71  bone]
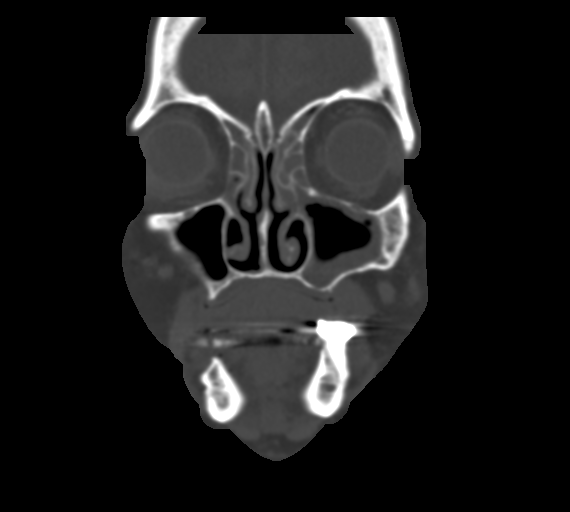
[im 39/71  bone]
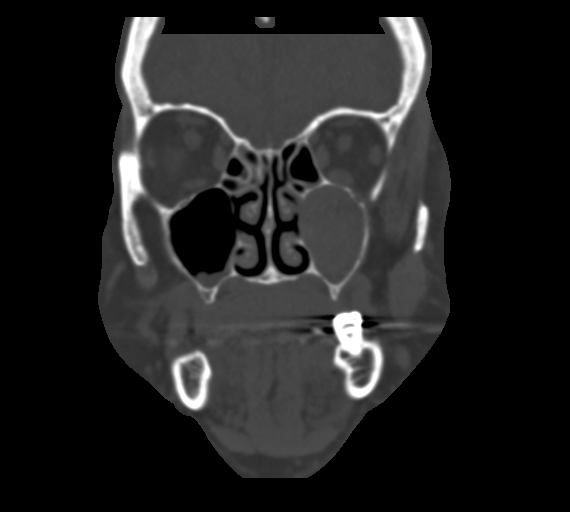

[Series 9: sagittal bone · sagittal · 0.28mm/px · 2 of 90 slices shown]
[im 30/90  bone]
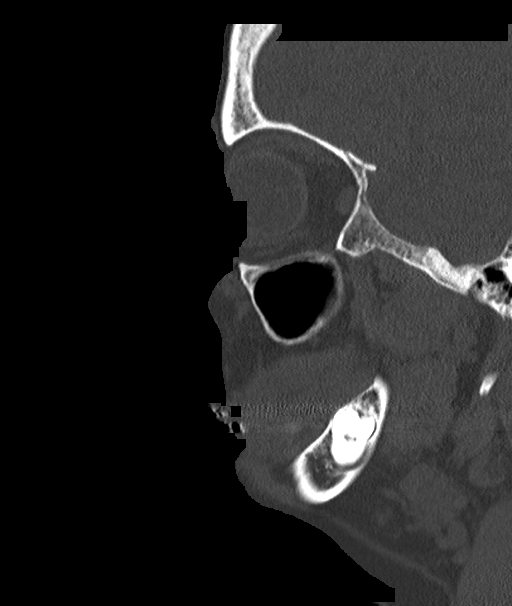
[im 60/90  bone]
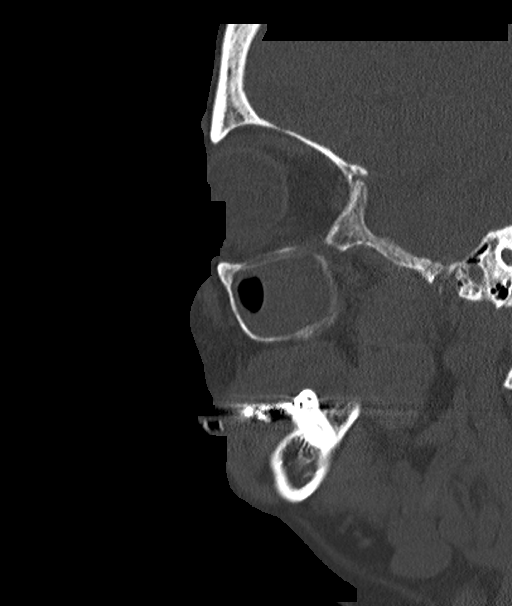

[15 of 47 positions shown; findings below may reference images not displayed]

FINDINGS: Osseous: No fracture or mandibular dislocation. No destructive
process.

Orbits: Negative. No traumatic or inflammatory finding.

Sinuses: Mucosal thickening and fluid levels in the left maxillary
and bilateral frontal sinuses. Mucosal disease effaces the outflow
of the sinuses. Bilateral anterior ethmoid opacification also noted.
Mucosal thickening narrows the sphenoid sinus ostia. Right maxillary
infundibulum is effaced but there is a patent secondary ostium.

A midline nasal septum.  No invasive features.

Bilateral partial opacification of mastoid air cells. Left middle
ear opacification. Negative nasopharynx.

Soft tissues: Negative.

Limited intracranial: No significant or unexpected finding.
IMPRESSION: 1. Acute sinusitis with fluid levels in the left maxillary and
bifrontal sinuses.
2. Bilateral mastoid opacification.

## 2019-09-17 ENCOUNTER — Ambulatory Visit: Payer: Medicare Other | Attending: Internal Medicine

## 2019-09-17 DIAGNOSIS — Z23 Encounter for immunization: Secondary | ICD-10-CM

## 2019-09-17 NOTE — Progress Notes (Signed)
   Covid-19 Vaccination Clinic  Name:  Kendra White    MRN: [id number] DOB: [birth year]  09/17/2019  [name] was observed post Covid-19 immunization for 15 minutes without incident. She was provided with Vaccine Information Sheet and instruction to access the V-Safe system.   [name] was instructed to call 911 with any severe reactions post vaccine: Marland Kitchen Difficulty breathing  . Swelling of face and throat  . A fast heartbeat  . A bad rash all over body  . Dizziness and weakness   Immunizations Administered    Name Date Dose VIS Date Route   Moderna COVID-19 Vaccine 09/17/2019  2:55 PM 0.5 mL 06/18/2019 Intramuscular   Manufacturer: Moderna   Lot: [id number]   NDC: 79150-413-64

## 2019-10-15 ENCOUNTER — Ambulatory Visit: Payer: Medicare Other | Attending: Internal Medicine

## 2019-10-15 DIAGNOSIS — Z23 Encounter for immunization: Secondary | ICD-10-CM

## 2019-10-15 NOTE — Progress Notes (Signed)
   Covid-19 Vaccination Clinic  Name:  LARENDA REEDY    MRN: [id number] DOB: Aug 23, 1930  10/15/2019  Ms. Feliciano was observed post Covid-19 immunization for 15 minutes without incident. She was provided with Vaccine Information Sheet and instruction to access the V-Safe system.   Ms. Corl was instructed to call 911 with any severe reactions post vaccine: Marland Kitchen Difficulty breathing  . Swelling of face and throat  . A fast heartbeat  . A bad rash all over body  . Dizziness and weakness   Immunizations Administered    Name Date Dose VIS Date Route   Moderna COVID-19 Vaccine 10/15/2019  1:35 PM 0.5 mL 06/18/2019 Intramuscular   Manufacturer: Moderna   Lot: [id number]   NDC: 00415-930-12

## 2023-01-01 ENCOUNTER — Emergency Department (HOSPITAL_COMMUNITY): Payer: Medicare Other

## 2023-01-01 ENCOUNTER — Other Ambulatory Visit: Payer: Self-pay

## 2023-01-01 ENCOUNTER — Observation Stay (HOSPITAL_COMMUNITY)
Admission: EM | Admit: 2023-01-01 | Discharge: 2023-01-04 | Disposition: A | Discharge status: Home Health | Payer: Medicare Other | Attending: Internal Medicine | Admitting: Internal Medicine

## 2023-01-01 DIAGNOSIS — E669 Obesity, unspecified: Secondary | ICD-10-CM | POA: Insufficient documentation

## 2023-01-01 DIAGNOSIS — R531 Weakness: Secondary | ICD-10-CM | POA: Diagnosis present

## 2023-01-01 DIAGNOSIS — E039 Hypothyroidism, unspecified: Secondary | ICD-10-CM | POA: Diagnosis not present

## 2023-01-01 DIAGNOSIS — Z7984 Long term (current) use of oral hypoglycemic drugs: Secondary | ICD-10-CM | POA: Insufficient documentation

## 2023-01-01 DIAGNOSIS — N390 Urinary tract infection, site not specified: Secondary | ICD-10-CM | POA: Diagnosis not present

## 2023-01-01 DIAGNOSIS — R251 Tremor, unspecified: Secondary | ICD-10-CM | POA: Diagnosis present

## 2023-01-01 DIAGNOSIS — I480 Paroxysmal atrial fibrillation: Secondary | ICD-10-CM | POA: Diagnosis not present

## 2023-01-01 DIAGNOSIS — I4891 Unspecified atrial fibrillation: Secondary | ICD-10-CM | POA: Diagnosis present

## 2023-01-01 DIAGNOSIS — I10 Essential (primary) hypertension: Secondary | ICD-10-CM | POA: Diagnosis not present

## 2023-01-01 DIAGNOSIS — E860 Dehydration: Secondary | ICD-10-CM | POA: Diagnosis not present

## 2023-01-01 DIAGNOSIS — Z6832 Body mass index (BMI) 32.0-32.9, adult: Secondary | ICD-10-CM | POA: Diagnosis not present

## 2023-01-01 DIAGNOSIS — Z8673 Personal history of transient ischemic attack (TIA), and cerebral infarction without residual deficits: Secondary | ICD-10-CM | POA: Insufficient documentation

## 2023-01-01 DIAGNOSIS — Z79899 Other long term (current) drug therapy: Secondary | ICD-10-CM | POA: Insufficient documentation

## 2023-01-01 DIAGNOSIS — N3000 Acute cystitis without hematuria: Secondary | ICD-10-CM

## 2023-01-01 DIAGNOSIS — R2242 Localized swelling, mass and lump, left lower limb: Secondary | ICD-10-CM | POA: Insufficient documentation

## 2023-01-01 DIAGNOSIS — F0390 Unspecified dementia without behavioral disturbance: Secondary | ICD-10-CM | POA: Diagnosis not present

## 2023-01-01 DIAGNOSIS — E119 Type 2 diabetes mellitus without complications: Secondary | ICD-10-CM | POA: Insufficient documentation

## 2023-01-01 DIAGNOSIS — M7989 Other specified soft tissue disorders: Secondary | ICD-10-CM | POA: Diagnosis present

## 2023-01-01 DIAGNOSIS — I2510 Atherosclerotic heart disease of native coronary artery without angina pectoris: Secondary | ICD-10-CM | POA: Insufficient documentation

## 2023-01-01 DIAGNOSIS — F419 Anxiety disorder, unspecified: Secondary | ICD-10-CM | POA: Diagnosis present

## 2023-01-01 LAB — HEPATIC FUNCTION PANEL
ALT: 16 U/L (ref 0–44)
AST: 27 U/L (ref 15–41)
Albumin: 3.7 g/dL (ref 3.5–5.0)
Alkaline Phosphatase: 20 U/L — ABNORMAL LOW (ref 38–126)
Bilirubin, Direct: 0.1 mg/dL (ref 0.0–0.2)
Indirect Bilirubin: 0.6 mg/dL (ref 0.3–0.9)
Total Bilirubin: 0.7 mg/dL (ref 0.3–1.2)
Total Protein: 6.8 g/dL (ref 6.5–8.1)

## 2023-01-01 LAB — CBC
HCT: 33.7 % — ABNORMAL LOW (ref 36.0–46.0)
Hemoglobin: 11 g/dL — ABNORMAL LOW (ref 12.0–15.0)
MCH: 29.9 pg (ref 26.0–34.0)
MCHC: 32.6 g/dL (ref 30.0–36.0)
MCV: 91.6 fL (ref 80.0–100.0)
Platelets: 334 10*3/uL (ref 150–400)
RBC: 3.68 MIL/uL — ABNORMAL LOW (ref 3.87–5.11)
RDW: 15.5 % (ref 11.5–15.5)
WBC: 5 10*3/uL (ref 4.0–10.5)
nRBC: 0 % (ref 0.0–0.2)

## 2023-01-01 LAB — BASIC METABOLIC PANEL
Anion gap: 11 (ref 5–15)
BUN: 30 mg/dL — ABNORMAL HIGH (ref 8–23)
CO2: 21 mmol/L — ABNORMAL LOW (ref 22–32)
Calcium: 9.3 mg/dL (ref 8.9–10.3)
Chloride: 104 mmol/L (ref 98–111)
Creatinine, Ser: 1.31 mg/dL — ABNORMAL HIGH (ref 0.44–1.00)
GFR, Estimated: 39 mL/min — ABNORMAL LOW (ref >60)
Glucose, Bld: 131 mg/dL — ABNORMAL HIGH (ref 70–99)
Potassium: 4.4 mmol/L (ref 3.5–5.1)
Sodium: 136 mmol/L (ref 135–145)

## 2023-01-01 LAB — URINALYSIS, ROUTINE W REFLEX MICROSCOPIC
Bilirubin Urine: NEGATIVE
Glucose, UA: NEGATIVE mg/dL
Hgb urine dipstick: NEGATIVE
Ketones, ur: NEGATIVE mg/dL
Nitrite: NEGATIVE
Protein, ur: NEGATIVE mg/dL
Specific Gravity, Urine: 1.008 (ref 1.005–1.030)
pH: 7 (ref 5.0–8.0)

## 2023-01-01 LAB — TROPONIN I (HIGH SENSITIVITY)
Troponin I (High Sensitivity): 13 ng/L (ref <18)
Troponin I (High Sensitivity): 14 ng/L (ref <18)

## 2023-01-01 LAB — CBG MONITORING, ED: Glucose-Capillary: 121 mg/dL — ABNORMAL HIGH (ref 70–99)

## 2023-01-01 MED ORDER — ACETAMINOPHEN 325 MG PO TABS
650.0000 mg | ORAL_TABLET | Freq: Four times a day (QID) | ORAL | Status: DC | PRN
  Administered 2023-01-02: 650 mg via ORAL
  Filled 2023-01-01: qty 2

## 2023-01-01 MED ORDER — SODIUM CHLORIDE 0.9 % IV BOLUS
500.0000 mL | Freq: Once | INTRAVENOUS | Status: AC
  Administered 2023-01-01: 500 mL via INTRAVENOUS

## 2023-01-01 MED ORDER — ALPRAZOLAM 0.25 MG PO TABS
0.1250 mg | ORAL_TABLET | Freq: Three times a day (TID) | ORAL | Status: DC | PRN
  Administered 2023-01-01: 0.25 mg via ORAL
  Filled 2023-01-01: qty 1

## 2023-01-01 MED ORDER — CITALOPRAM HYDROBROMIDE 20 MG PO TABS: 40.0000 mg | ORAL_TABLET | Freq: Every day | ORAL | Status: DC

## 2023-01-01 MED ORDER — DONEPEZIL HCL 5 MG PO TABS
5.0000 mg | ORAL_TABLET | Freq: Every day | ORAL | Status: DC
  Administered 2023-01-01 – 2023-01-03 (×3): 5 mg via ORAL
  Filled 2023-01-01 (×3): qty 1

## 2023-01-01 MED ORDER — BUPROPION HCL ER (XL) 300 MG PO TB24: 300.0000 mg | ORAL_TABLET | Freq: Every morning | ORAL | Status: DC

## 2023-01-01 MED ORDER — SODIUM CHLORIDE 0.9 % IV SOLN
2.0000 g | Freq: Once | INTRAVENOUS | Status: AC
  Administered 2023-01-01: 2 g via INTRAVENOUS
  Filled 2023-01-01: qty 20

## 2023-01-01 MED ORDER — ACETAMINOPHEN 650 MG RE SUPP: 650.0000 mg | Freq: Four times a day (QID) | RECTAL | Status: DC | PRN

## 2023-01-01 MED ORDER — POLYETHYLENE GLYCOL 3350 17 G PO PACK: 17.0000 g | PACK | Freq: Every day | ORAL | Status: DC | PRN

## 2023-01-01 MED ORDER — LOSARTAN POTASSIUM 50 MG PO TABS
50.0000 mg | ORAL_TABLET | Freq: Every day | ORAL | Status: DC
  Administered 2023-01-02 – 2023-01-04 (×3): 50 mg via ORAL
  Filled 2023-01-01 (×3): qty 1

## 2023-01-01 MED ORDER — LOSARTAN POTASSIUM-HCTZ 50-12.5 MG PO TABS: 1.0000 | ORAL_TABLET | Freq: Every day | ORAL | Status: DC

## 2023-01-01 MED ORDER — PANTOPRAZOLE SODIUM 40 MG PO TBEC
40.0000 mg | DELAYED_RELEASE_TABLET | Freq: Two times a day (BID) | ORAL | Status: DC
  Administered 2023-01-02 – 2023-01-04 (×5): 40 mg via ORAL
  Filled 2023-01-01 (×5): qty 1

## 2023-01-01 MED ORDER — LEVOTHYROXINE SODIUM 50 MCG PO TABS
50.0000 ug | ORAL_TABLET | Freq: Every day | ORAL | Status: DC
  Administered 2023-01-02 – 2023-01-04 (×3): 50 ug via ORAL
  Filled 2023-01-01 (×3): qty 1

## 2023-01-01 MED ORDER — PRIMIDONE 50 MG PO TABS
50.0000 mg | ORAL_TABLET | Freq: Three times a day (TID) | ORAL | Status: DC
  Administered 2023-01-02 – 2023-01-04 (×7): 50 mg via ORAL
  Filled 2023-01-01 (×7): qty 1

## 2023-01-01 MED ORDER — ROSUVASTATIN CALCIUM 20 MG PO TABS
40.0000 mg | ORAL_TABLET | Freq: Every day | ORAL | Status: DC
  Administered 2023-01-02 – 2023-01-04 (×3): 40 mg via ORAL
  Filled 2023-01-01 (×3): qty 2

## 2023-01-01 MED ORDER — SODIUM CHLORIDE 0.9 % IV SOLN
1.0000 g | INTRAVENOUS | Status: DC
  Administered 2023-01-02 – 2023-01-03 (×2): 1 g via INTRAVENOUS
  Filled 2023-01-01 (×2): qty 10

## 2023-01-01 MED ORDER — MEMANTINE HCL 10 MG PO TABS
5.0000 mg | ORAL_TABLET | Freq: Two times a day (BID) | ORAL | Status: DC
  Administered 2023-01-01 – 2023-01-04 (×6): 5 mg via ORAL
  Filled 2023-01-01 (×6): qty 1

## 2023-01-01 MED ORDER — ENOXAPARIN SODIUM 30 MG/0.3ML IJ SOSY
30.0000 mg | PREFILLED_SYRINGE | INTRAMUSCULAR | Status: DC
  Administered 2023-01-02 – 2023-01-04 (×3): 30 mg via SUBCUTANEOUS
  Filled 2023-01-01 (×3): qty 0.3

## 2023-01-01 MED ORDER — HYDROCHLOROTHIAZIDE 12.5 MG PO TABS
12.5000 mg | ORAL_TABLET | Freq: Every day | ORAL | Status: DC
  Administered 2023-01-02 – 2023-01-04 (×3): 12.5 mg via ORAL
  Filled 2023-01-01 (×3): qty 1

## 2023-01-01 NOTE — ED Triage Notes (Signed)
Pt arrived [facility] for c/o weakness & headaches x 1 week progressing. Pt also c/o HTN and diarrhea started today. Pt has tremors but are getting worse. Pt has a sitter during the week but not the weekend. BP with [facility] 200/110

## 2023-01-01 NOTE — H&P (Signed)
History and Physical    Kendra White WUJ:[id number] DOB: 10-30-1930 DOA: 01/01/2023  PCP: Joette Catching, MD   Patient coming from: Home  I have personally briefly reviewed patient's old medical records in [facility]  Chief Complaint: Weakness  HPI: Kendra White is a 89 y.o. female with medical history significant for diabetes mellitus, hypertension, dementia.  Patient presented to the ED with complaints of generalized weakness, and headache of about 1 week duration.  She reports over the past 2 days she is barely been able to ambulate.  She has a Comptroller that with her during the week, but over the weekend she is alone.  Reports urinary symptoms of frequency over the past few weeks.  Reports swelling to her left lower extremity also over the past several days.  No pain to her left lower extremity.  No chest pain no difficulty breathing.  No cough no fevers.  She reports tremors to her mouth, and jerking of her left upper extremity that is worse than baseline.   ED Course: Stable vitals.  WBC 5.  Troponin 13 > 14.  Chest x-ray shows mild left atelectasis/airspace disease.  CT negative for acute abnormality.  UA- mod leukocytes rare bacteria.  EKG shows atrial fibrillation. IV ceftriaxone started. 1 L bolus given. Admission for UTI, generalized weakness.  Review of Systems: As per HPI all other systems reviewed and negative.  Past Medical History:  Diagnosis Date   Acute renal insufficiency 09/16/2016   Anxiety    Blood transfusion    Coronary artery disease due to lipid rich plaque 10/27/2016   Dependent edema    Diabetes mellitus    Essential hypertension 08/12/2016   Headache(784.0)    Hx-TIA (transient ischemic attack)    Hypercholesteremia    Moderate episode of recurrent major depressive disorder (HCC) 12/07/2016   Sciatica, left side 08/12/2016   Swelling of ankle    and abdomen   Ulcer    Vascular dementia without behavioral disturbance 11/11/2016    Past Surgical  History:  Procedure Laterality Date   ABDOMINAL HYSTERECTOMY     COLONOSCOPY     DILATION AND CURETTAGE OF UTERUS     ESOPHAGOGASTRODUODENOSCOPY (EGD) WITH PROPOFOL N/A 03/30/2017   Procedure: ESOPHAGOGASTRODUODENOSCOPY (EGD) WITH PROPOFOL;  Surgeon: Sherrilyn Rist, MD;  Location: [facility];  Service: Gastroenterology;  Laterality: N/A;   LAPAROSCOPIC APPENDECTOMY  02/07/2011   Procedure: APPENDECTOMY LAPAROSCOPIC;  Surgeon: Dalia Heading;  Location: [facility];  Service: General;  Laterality: N/A;   POLYPECTOMY       reports that she has never smoked. She has never used smokeless tobacco. She reports that she does not drink alcohol and does not use drugs.  Allergies  Allergen Reactions   Atorvastatin     Other reaction(s): Constipation   Diphenhydramine Hcl Rash   Rosuvastatin     Other reaction(s): Constipation    Family History  Problem Relation Age of Onset   Colon cancer Father    Colon polyps Maternal Aunt    Pancreatic cancer Brother     Prior to Admission medications   Medication Sig Start Date End Date Taking? Authorizing Provider  acetaminophen (TYLENOL) 325 MG tablet Take 325-650 mg by mouth daily as needed for mild pain or moderate pain. For pain    Yes [provider]  ALPRAZolam (XANAX) 0.25 MG tablet Take 0.125-0.25 mg by mouth 3 (three) times daily as needed for anxiety.    Yes [provider]  Biotin 5000 MCG CAPS Take by mouth daily.   Yes [provider]  buPROPion (WELLBUTRIN XL) 300 MG 24 hr tablet Take 300 mg by mouth every morning.   Yes [provider]  citalopram (CELEXA) 40 MG tablet Take 40 mg by mouth daily.   Yes [provider]  donepezil (ARICEPT) 5 MG tablet Take 5 mg at bedtime by mouth. 05/19/17  Yes [provider]  fenofibrate 160 MG tablet Take 160 mg by mouth daily.   Yes [provider]  fluticasone (FLONASE) 50 MCG/ACT nasal spray Place 2 sprays into both nostrils daily.    Yes [provider]  Garlic 1000 MG CAPS Take by mouth.   Yes [provider]  levothyroxine (SYNTHROID) 50 MCG tablet Take 50 mcg by mouth daily.   Yes [provider]  losartan-hydrochlorothiazide (HYZAAR) 50-12.5 MG tablet Take 1 tablet by mouth daily.   Yes [provider]  memantine (NAMENDA) 5 MG tablet Take 5 mg by mouth 2 (two) times daily.   Yes [provider]  metFORMIN (GLUCOPHAGE) 500 MG tablet Take 500 mg by mouth at bedtime.    Yes [provider]  Multiple Vitamins-Minerals (MULTIVITAMIN WITH MINERALS) tablet Take 1 tablet by mouth daily.     Yes [provider]  Omega-3 Fatty Acids (FISH OIL) 1000 MG CAPS Take by mouth.   Yes [provider]  pantoprazole (PROTONIX) 40 MG tablet Take 1 tablet (40 mg total) by mouth 2 (two) times daily before a meal. 03/31/17 01/01/23 Yes Noralee Stain, DO  primidone (MYSOLINE) 50 MG tablet Take 50 mg by mouth 3 (three) times daily. 05/08/17  Yes [provider]  rosuvastatin (CRESTOR) 40 MG tablet Take 40 mg by mouth daily.   Yes [provider]  tretinoin (RETIN-A) 0.05 % cream Apply topically. 06/01/22 06/01/23 Yes [provider]    Physical Exam: Vitals:   01/01/23 1221 01/01/23 1226 01/01/23 1227 01/01/23 1649  BP:      Pulse:   81   Resp:   15   Temp:  97.9 F (36.6 C)  98.5 F (36.9 C)  TempSrc:  Oral  Oral  SpO2:   96%   Weight: 81.6 kg     Height: 5\' 2"  (1.575 m)       Constitutional: NAD, calm, comfortable Vitals:   01/01/23 1221 01/01/23 1226 01/01/23 1227 01/01/23 1649  BP:      Pulse:   81   Resp:   15   Temp:  97.9 F (36.6 C)  98.5 F (36.9 C)  TempSrc:  Oral  Oral  SpO2:   96%   Weight: 81.6 kg     Height: 5\' 2"  (1.575 m)      Eyes: PERRL, lids and conjunctivae normal ENMT: Mucous membranes are moist.  Neck: normal, supple, no masses, no thyromegaly Respiratory: clear to auscultation bilaterally, no wheezing,  no crackles. Normal respiratory effort. No accessory muscle use.  Cardiovascular: Regular rate and rhythm, no murmurs / rubs / gallops. 1 + pitting left lower extremity edema.  Abdomen: no tenderness, no masses palpated. No hepatosplenomegaly. Bowel sounds positive.  Musculoskeletal: no clubbing / cyanosis. No joint deformity upper and lower extremities.  Skin: no rashes, lesions, ulcers. No induration Neurologic: No Facial asymmetry, speech clear and fluent without aphasia, moving all extremities spontaneously.  Area about around mouth appears to be tremulous, with intermittent twitching to right upper extremity. Psychiatric: Normal judgment and insight. Alert and oriented x  3. Normal mood.   Labs on Admission: I have personally reviewed following labs and imaging studies  CBC: Recent Labs  Lab 01/01/23 1243  WBC 5.0  HGB 11.0*  HCT 33.7*  MCV 91.6  PLT 334   Basic Metabolic Panel: Recent Labs  Lab 01/01/23 1243  NA 136  K 4.4  CL 104  CO2 21*  GLUCOSE 131*  BUN 30*  CREATININE 1.31*  CALCIUM 9.3   GFR: Estimated Creatinine Clearance: 28.3 mL/min (A) (by C-G formula based on SCr of 1.31 mg/dL (H)). Liver Function Tests: Recent Labs  Lab 01/01/23 1246  AST 27  ALT 16  ALKPHOS 20*  BILITOT 0.7  PROT 6.8  ALBUMIN 3.7   CBG: Recent Labs  Lab 01/01/23 1410  GLUCAP 121*    Urine analysis:    Component Value Date/Time   COLORURINE YELLOW 01/01/2023 1711   APPEARANCEUR HAZY (A) 01/01/2023 1711   LABSPEC 1.008 01/01/2023 1711   PHURINE 7.0 01/01/2023 1711   GLUCOSEU NEGATIVE 01/01/2023 1711   HGBUR NEGATIVE 01/01/2023 1711   BILIRUBINUR NEGATIVE 01/01/2023 1711   KETONESUR NEGATIVE 01/01/2023 1711   PROTEINUR NEGATIVE 01/01/2023 1711   UROBILINOGEN 1.0 02/07/2011 1207   NITRITE NEGATIVE 01/01/2023 1711   LEUKOCYTESUR MODERATE (A) 01/01/2023 1711    Radiological Exams on Admission: DG Chest Portable 1 View  Result Date: 01/01/2023 CLINICAL DATA:   Altered mental status, weakness, headaches. EXAM: PORTABLE CHEST 1 VIEW COMPARISON:  Chest radiograph dated 09/16/2016. FINDINGS: The heart size is at the upper limits of normal given portable technique. Vascular calcifications are seen in the aortic arch. There is mild left basilar atelectasis/airspace disease. The right lung is clear. There is no pleural effusion or pneumothorax. The visualized skeletal structures are unremarkable. IMPRESSION: Mild left basilar atelectasis/airspace disease. Electronically Signed   By: Romona Curls M.D.   On: 01/01/2023 14:15   CT Head Wo Contrast  Result Date: 01/01/2023 CLINICAL DATA:  Mental status change. EXAM: CT HEAD WITHOUT CONTRAST TECHNIQUE: Contiguous axial images were obtained from the base of the skull through the vertex without intravenous contrast. RADIATION DOSE REDUCTION: This exam was performed according to the departmental dose-optimization program which includes automated exposure control, adjustment of the mA and/or kV according to patient size and/or use of iterative reconstruction technique. COMPARISON:  CT maxillofacial 09/18/2016 FINDINGS: Brain: Moderate atrophy and white matter disease is again noted. No acute infarct, hemorrhage, or mass lesion is present. The ventricles are of normal size. Deep brain nuclei are within normal limits. No significant extraaxial fluid collection is present. The brainstem and cerebellum are within normal limits. Vascular: Atherosclerotic calcifications are present within the cavernous internal carotid arteries bilaterally. Calcifications are present the dural margin of the left vertebral artery. No hyperdense vessel is present. Skull: Calvarium is intact. No focal lytic or blastic lesions are present. No significant extracranial soft tissue lesion is present. Sinuses/Orbits: The paranasal sinuses and mastoid air cells are clear. Bilateral lens replacements are noted. Globes and orbits are otherwise unremarkable.  IMPRESSION: 1. No acute intracranial abnormality or significant interval change. 2. Stable atrophy and white matter disease. This likely reflects the sequela of chronic microvascular ischemia. Electronically Signed   By: Marin Roberts M.D.   On: 01/01/2023 13:30    EKG: Independently reviewed.  Atrial fibrillation, rate of 87, QTc 536.  No significant ST or T wave changes from prior.  Assessment/Plan Principal Problem:   Generalized weakness Active Problems:   UTI (urinary tract infection)   Left  leg swelling   Tremor   New onset atrial fibrillation (HCC)  Assessment and Plan: * Generalized weakness Generalized weakness likely secondary to UTI.  UA suggestive with moderate leukocytes.  She reports urinary symptoms..  Rules out for sepsis.  Lives at home,  has sitter that stays with her during the week, on the weekends she is alone. -IV Ceftriaxone 1 g daily -Follow-up urine cultures - PT eval  New onset atrial fibrillation (HCC) Asymptomatic.  No documentation of prior irregular heart rhythm.  No echo on file.  Heart rate 80s.  Potassium 4.4.  QTc prolonged at 436 -Magnesium -Troponins x 2- 13 > 14. - Obtain echo - Repeat EKG in the morning -Obtain advanced age, anticoagulation not started, will need to discuss risk and benefits of anticoagulation with patient and family  Left leg swelling - Left lower extremity venous Doppler, rule out DVT.  Anxiety History of dementia, anxiety.  Reports worsening tremors, twitching.   On several psychoactive agents, with QT prolongation -Resume Xanax, donepezil, memantine, primidone -Hold Celexa and bupropion for now    DVT prophylaxis: Lovenox Code Status: DNR-patient has a living will stating same, confirmed with patient at bedside Family Communication: none at bedside Disposition Plan: ~ 2 days Consults called:None  Admission status:  Obs tele    Author: Onnie Boer, MD 01/01/2023 11:31 PM  For on call review  [email or web address].

## 2023-01-01 NOTE — ED Notes (Signed)
Patient transported to CT 

## 2023-01-01 NOTE — Assessment & Plan Note (Signed)
Generalized weakness likely secondary to UTI.  UA suggestive with moderate leukocytes.  She reports urinary symptoms..  Rules out for sepsis.  Lives at home,  has sitter that stays with her during the week, on the weekends she is alone. -IV Ceftriaxone 1 g daily -Follow-up urine cultures - PT eval

## 2023-01-01 NOTE — ED Notes (Signed)
Attempted to call report x 1  

## 2023-01-01 NOTE — Assessment & Plan Note (Signed)
History of dementia, anxiety.  Reports worsening tremors, twitching.   On several psychoactive agents, with QT prolongation -Resume Xanax, donepezil, memantine, primidone -Hold Celexa and bupropion for now

## 2023-01-01 NOTE — ED Provider Notes (Signed)
Kendra White   CSN: [id number] Arrival date & time: 01/01/23  1153     History  Chief Complaint  Patient presents with   Weakness    Kendra White is a 89 y.o. female.  Pt is a 89 yo female with pmhx significant for CAD, HDN, DM, OA, hypothyroidism, GERD, IBS, anxiety, Depression, essential tremor, and HLD.  Pt called EMS today due to progressive weakness and headaches over the past few days.  She did have some diarrhea last night.  She was unable to feed herself this am or walk to the bathroom.  She is normally able to do these things herself.         Home Medications Prior to Admission medications   Medication Sig Start Date End Date Taking? Authorizing Provider  acetaminophen (TYLENOL) 325 MG tablet Take 325-650 mg by mouth daily as needed for mild pain or moderate pain. For pain    Yes [provider]  ALPRAZolam (XANAX) 0.25 MG tablet Take 0.125-0.25 mg by mouth 3 (three) times daily as needed for anxiety.    Yes [provider]  Biotin 5000 MCG CAPS Take by mouth daily.   Yes [provider]  buPROPion (WELLBUTRIN XL) 300 MG 24 hr tablet Take 300 mg by mouth every morning.   Yes [provider]  citalopram (CELEXA) 40 MG tablet Take 40 mg by mouth daily.   Yes [provider]  donepezil (ARICEPT) 5 MG tablet Take 5 mg at bedtime by mouth. 05/19/17  Yes [provider]  fenofibrate 160 MG tablet Take 160 mg by mouth daily.   Yes [provider]  fluticasone (FLONASE) 50 MCG/ACT nasal spray Place 2 sprays into both nostrils daily.   Yes [provider]  Garlic 1000 MG CAPS Take by mouth.   Yes [provider]  levothyroxine (SYNTHROID) 50 MCG tablet Take 50 mcg by mouth daily.   Yes [provider]  losartan-hydrochlorothiazide (HYZAAR) 50-12.5 MG tablet Take 1 tablet by mouth daily.   Yes [provider]  memantine  (NAMENDA) 5 MG tablet Take 5 mg by mouth 2 (two) times daily.   Yes [provider]  metFORMIN (GLUCOPHAGE) 500 MG tablet Take 500 mg by mouth at bedtime.    Yes [provider]  Multiple Vitamins-Minerals (MULTIVITAMIN WITH MINERALS) tablet Take 1 tablet by mouth daily.     Yes [provider]  Omega-3 Fatty Acids (FISH OIL) 1000 MG CAPS Take by mouth.   Yes [provider]  pantoprazole (PROTONIX) 40 MG tablet Take 1 tablet (40 mg total) by mouth 2 (two) times daily before a meal. 03/31/17 01/01/23 Yes Noralee Stain, DO  primidone (MYSOLINE) 50 MG tablet Take 50 mg by mouth 3 (three) times daily. 05/08/17  Yes [provider]  rosuvastatin (CRESTOR) 40 MG tablet Take 40 mg by mouth daily.   Yes [provider]  tretinoin (RETIN-A) 0.05 % cream Apply topically. 06/01/22 06/01/23 Yes [provider]      Allergies    Atorvastatin, Diphenhydramine hcl, and Rosuvastatin    Review of Systems   Review of Systems  Neurological:  Positive for weakness.  All other systems reviewed and are negative.   Physical Exam Updated Vital Signs BP (!) 146/75   Pulse 81   Temp 97.9 F (36.6 C) (Oral)   Resp 15   Ht 5\' 2"  (1.575 m)   Wt 81.6 kg  SpO2 96%   BMI 32.92 kg/m  Physical Exam Vitals and nursing White reviewed.  Constitutional:      Appearance: Normal appearance. She is obese.  HENT:     Head: Normocephalic and atraumatic.     Right Ear: External ear normal.     Left Ear: External ear normal.     Nose: Nose normal.     Mouth/Throat:     Mouth: Mucous membranes are moist.     Pharynx: Oropharynx is clear.  Eyes:     Extraocular Movements: Extraocular movements intact.     Conjunctiva/sclera: Conjunctivae normal.     Pupils: Pupils are equal, round, and reactive to light.  Cardiovascular:     Rate and Rhythm: Normal rate and regular rhythm.     Pulses: Normal pulses.     Heart sounds: Normal heart sounds.  Pulmonary:      Effort: Pulmonary effort is normal.     Breath sounds: Normal breath sounds.  Abdominal:     General: Abdomen is flat. Bowel sounds are normal.     Palpations: Abdomen is soft.  Musculoskeletal:        General: Normal range of motion.     Cervical back: Normal range of motion and neck supple.  Skin:    General: Skin is warm.     Capillary Refill: Capillary refill takes less than 2 seconds.  Neurological:     General: No focal deficit present.     Mental Status: She is alert and oriented to person, place, and time.     Motor: Tremor present.  Psychiatric:        Mood and Affect: Mood normal.        Behavior: Behavior normal.     ED Results / Procedures / Treatments   Labs (all labs ordered are listed, but only abnormal results are displayed) Labs Reviewed  BASIC METABOLIC PANEL - Abnormal; Notable for the following components:      Result Value   CO2 21 (*)    Glucose, Bld 131 (*)    BUN 30 (*)    Creatinine, Ser 1.31 (*)    GFR, Estimated 39 (*)    All other components within normal limits  CBC - Abnormal; Notable for the following components:   RBC 3.68 (*)    Hemoglobin 11.0 (*)    HCT 33.7 (*)    All other components within normal limits  HEPATIC FUNCTION PANEL - Abnormal; Notable for the following components:   Alkaline Phosphatase 20 (*)    All other components within normal limits  CBG MONITORING, ED - Abnormal; Notable for the following components:   Glucose-Capillary 121 (*)    All other components within normal limits  URINALYSIS, ROUTINE W REFLEX MICROSCOPIC  TROPONIN I (HIGH SENSITIVITY)  TROPONIN I (HIGH SENSITIVITY)    EKG EKG Interpretation  Date/Time:  Sunday January 01 2023 12:19:54 EDT Ventricular Rate:  87 PR Interval:    QRS Duration: 79 QT Interval:  445 QTC Calculation: 536 R Axis:   67 Text Interpretation: Normal sinus rhythm Anterior infarct, old Borderline ST depression, anterolateral leads Prolonged QT interval No significant  change since last tracing Confirmed by Jacalyn Lefevre [phone]) on 01/01/2023 3:32:32 PM  Radiology DG Chest Portable 1 View  Result Date: 01/01/2023 CLINICAL DATA:  Altered mental status, weakness, headaches. EXAM: PORTABLE CHEST 1 VIEW COMPARISON:  Chest radiograph dated 09/16/2016. FINDINGS: The heart size is at the upper limits of normal given portable technique. Vascular calcifications  are seen in the aortic arch. There is mild left basilar atelectasis/airspace disease. The right lung is clear. There is no pleural effusion or pneumothorax. The visualized skeletal structures are unremarkable. IMPRESSION: Mild left basilar atelectasis/airspace disease. Electronically Signed   By: Romona Curls M.D.   On: 01/01/2023 14:15   CT Head Wo Contrast  Result Date: 01/01/2023 CLINICAL DATA:  Mental status change. EXAM: CT HEAD WITHOUT CONTRAST TECHNIQUE: Contiguous axial images were obtained from the base of the skull through the vertex without intravenous contrast. RADIATION DOSE REDUCTION: This exam was performed according to the departmental dose-optimization program which includes automated exposure control, adjustment of the mA and/or kV according to patient size and/or use of iterative reconstruction technique. COMPARISON:  CT maxillofacial 09/18/2016 FINDINGS: Brain: Moderate atrophy and white matter disease is again noted. No acute infarct, hemorrhage, or mass lesion is present. The ventricles are of normal size. Deep brain nuclei are within normal limits. No significant extraaxial fluid collection is present. The brainstem and cerebellum are within normal limits. Vascular: Atherosclerotic calcifications are present within the cavernous internal carotid arteries bilaterally. Calcifications are present the dural margin of the left vertebral artery. No hyperdense vessel is present. Skull: Calvarium is intact. No focal lytic or blastic lesions are present. No significant extracranial soft tissue lesion is  present. Sinuses/Orbits: The paranasal sinuses and mastoid air cells are clear. Bilateral lens replacements are noted. Globes and orbits are otherwise unremarkable. IMPRESSION: 1. No acute intracranial abnormality or significant interval change. 2. Stable atrophy and white matter disease. This likely reflects the sequela of chronic microvascular ischemia. Electronically Signed   By: Marin Roberts M.D.   On: 01/01/2023 13:30    Procedures Procedures    Medications Ordered in ED Medications  sodium chloride 0.9 % bolus 500 mL (has no administration in time range)  sodium chloride 0.9 % bolus 500 mL (500 mLs Intravenous New Bag/Given 01/01/23 1320)    ED Course/ Medical Decision Making/ A&P                             Medical Decision Making Amount and/or Complexity of Data Reviewed Labs: ordered. Radiology: ordered.   This patient presents to the ED for concern of weakness, this involves an extensive number of treatment options, and is a complaint that carries with it a high risk of complications and morbidity.  The differential diagnosis includes infection, anemia, cva,    Co morbidities that complicate the patient evaluation  CAD, HDN, DM, OA, hypothyroidism, GERD, IBS, anxiety, Depression, essential tremor, and HLD   Additional history obtained:  Additional history obtained from epic chart review External records from outside source obtained and reviewed including EMS report   Lab Tests:  I Ordered, and personally interpreted labs.  The pertinent results include:  cbc with chronic anemia (hgb 11 which is improved), bmp with cr 1.31 (mild aki; last cr 1.05); ua pending   Imaging Studies ordered:  I ordered imaging studies including cxr and CT head  I independently visualized and interpreted imaging which showed  CXR: Mild left basilar atelectasis/airspace disease.  CT head:  No acute intracranial abnormality or significant interval change.  2. Stable atrophy and  white matter disease. This likely reflects the  sequela of chronic microvascular ischemia.   I agree with the radiologist interpretation   Cardiac Monitoring:  The patient was maintained on a cardiac monitor.  I personally viewed and interpreted the cardiac monitored which  showed an underlying rhythm of: nsr   Medicines ordered and prescription drug management:  I ordered medication including ivfs  for sx  Reevaluation of the patient after these medicines showed that the patient improved I have reviewed the patients home medicines and have made adjustments as needed   Test Considered:  ct   Critical Interventions:  ivfs   Problem List / ED Course:  Dehydration:  mild aki.  Pt given ivfs.  Urine pending.  Pt signed out at shift change.   Reevaluation:  After the interventions noted above, I reevaluated the patient and found that they have :improved   Social Determinants of Health:  Lives at home alone   Dispostion:  Pending at shift change        Final Clinical Impression(s) / ED Diagnoses Final diagnoses:  Weakness  Dehydration    Rx / DC Orders ED Discharge Orders     None         Jacalyn Lefevre, MD 01/01/23 1534

## 2023-01-01 NOTE — Assessment & Plan Note (Signed)
-   Left lower extremity venous Doppler, rule out DVT.

## 2023-01-01 NOTE — Assessment & Plan Note (Signed)
Asymptomatic.  No documentation of prior irregular heart rhythm.  No echo on file.  Heart rate 80s.  Potassium 4.4.  QTc prolonged at 436 -Magnesium -Troponins x 2- 13 > 14. - Obtain echo - Repeat EKG in the morning -Obtain advanced age, anticoagulation not started, will need to discuss risk and benefits of anticoagulation with patient and family

## 2023-01-02 ENCOUNTER — Observation Stay (HOSPITAL_COMMUNITY): Payer: Medicare Other

## 2023-01-02 ENCOUNTER — Encounter (HOSPITAL_COMMUNITY): Payer: Self-pay | Admitting: Internal Medicine

## 2023-01-02 DIAGNOSIS — R531 Weakness: Secondary | ICD-10-CM | POA: Diagnosis not present

## 2023-01-02 LAB — MAGNESIUM: Magnesium: 1.7 mg/dL (ref 1.7–2.4)

## 2023-01-02 LAB — TSH: TSH: 2.572 u[IU]/mL (ref 0.350–4.500)

## 2023-01-02 MED ORDER — BUPROPION HCL ER (XL) 300 MG PO TB24
300.0000 mg | ORAL_TABLET | Freq: Every morning | ORAL | Status: DC
  Administered 2023-01-02 – 2023-01-04 (×3): 300 mg via ORAL
  Filled 2023-01-02 (×3): qty 1

## 2023-01-02 MED ORDER — CITALOPRAM HYDROBROMIDE 20 MG PO TABS
40.0000 mg | ORAL_TABLET | Freq: Every day | ORAL | Status: DC
  Administered 2023-01-02 – 2023-01-04 (×3): 40 mg via ORAL
  Filled 2023-01-02 (×3): qty 2

## 2023-01-02 NOTE — Progress Notes (Signed)
Mobility Specialist Progress Note:   01/02/23 1426  Mobility  Activity Transferred from bed to chair  Level of Assistance Moderate assist, patient does 50-74%  Assistive Device Front wheel walker  Distance Ambulated (ft) 3 ft  Range of Motion/Exercises Active;All extremities  Activity Response Tolerated well  Mobility Referral Yes  $Mobility charge 1 Mobility  Mobility Specialist Start Time (ACUTE ONLY) 1300  Mobility Specialist Stop Time (ACUTE ONLY) 1315  Mobility Specialist Time Calculation (min) (ACUTE ONLY) 15 min   Pt agreeable to mobility session, daughter in room. Transferred B>C with ModA and RW. Tolerated well, tremors throughout. Left pt in chair, all needs met, daughter still in room.   Feliciana Rossetti Mobility Specialist Please contact via Special educational needs teacher or  Rehab office at [phone]

## 2023-01-02 NOTE — Evaluation (Signed)
Physical Therapy Evaluation Patient Details Name: Kendra White MRN: [id number] DOB: 04-30-1931 Today's Date: 01/02/2023  History of Present Illness  Kendra White is a 89 y.o. female with medical history significant for diabetes mellitus, hypertension, dementia.  Patient presented to the ED with complaints of generalized weakness, and headache of about 1 week duration.  She reports over the past 2 days she is barely been able to ambulate.  She has a Comptroller that with her during the week, but over the weekend she is alone.  Reports urinary symptoms of frequency over the past few weeks.  Reports swelling to her left lower extremity also over the past several days.  No pain to her left lower extremity.  No chest pain no difficulty breathing.  No cough no fevers.  She reports tremors to her mouth, and jerking of her left upper extremity that is worse than baseline.   Clinical Impression  Patient demonstrates slow labored movement for sitting up at bedside with difficulty moving legs due to weakness, very unsteady on feet, at high risk for falls due to buckling of knees and fatigues easily after taking a couple of steps.  Patient tolerated staying up in chair after therapy.  Patient will benefit from continued skilled physical therapy in hospital and recommended venue below to increase strength, balance, endurance for safe ADLs and gait.          Recommendations for follow up therapy are one component of a multi-disciplinary discharge planning process, led by the attending physician.  Recommendations may be updated based on patient status, additional functional criteria and insurance authorization.  Follow Up Recommendations Can patient physically be transported by private vehicle: No     Assistance Recommended at Discharge    Patient can return home with the following  A lot of help with bathing/dressing/bathroom;A lot of help with walking and/or transfers;Help with stairs or ramp for  entrance;Assistance with cooking/housework    Equipment Recommendations None recommended by PT  Recommendations for Other Services       Functional Status Assessment Patient has had a recent decline in their functional status and demonstrates the ability to make significant improvements in function in a reasonable and predictable amount of time.     Precautions / Restrictions Precautions Precautions: Fall Restrictions Weight Bearing Restrictions: No      Mobility  Bed Mobility Overal bed mobility: Needs Assistance Bed Mobility: Supine to Sit, Sit to Supine     Supine to sit: Min assist Sit to supine: Min assist   General bed mobility comments: slow labored movement    Transfers Overall transfer level: Needs assistance Equipment used: Rolling walker (2 wheels) Transfers: Sit to/from Stand, Bed to chair/wheelchair/BSC Sit to Stand: Mod assist   Step pivot transfers: Mod assist       General transfer comment: unsteady labored movement    Ambulation/Gait Ambulation/Gait assistance: Mod assist, Max assist Gait Distance (Feet): 4 Feet Assistive device: Rolling walker (2 wheels) Gait Pattern/deviations: Decreased step length - right, Decreased step length - left, Decreased stride length, Shuffle, Knees buckling Gait velocity: slow     General Gait Details: slow labored movement with buckling of knees once fatigued  Stairs            Wheelchair Mobility    Modified Rankin (Stroke Patients Only)       Balance Overall balance assessment: Needs assistance Sitting-balance support: Feet supported, No upper extremity supported Sitting balance-Leahy Scale: Fair Sitting balance - Comments: fair/good seated at EOB  Standing balance support: During functional activity, Reliant on assistive device for balance, Bilateral upper extremity supported Standing balance-Leahy Scale: Poor Standing balance comment: using RW                              Pertinent Vitals/Pain Pain Assessment Pain Assessment: No/denies pain    Home Living Family/patient expects to be discharged to:: Private residence Living Arrangements: Alone Available Help at Discharge: Personal care attendant Type of Home: House Home Access: Stairs to enter Entrance Stairs-Rails: Right Entrance Stairs-Number of Steps: 1   Home Layout: Able to live on main level with bedroom/bathroom;Laundry or work area in [employer] Equipment: Agricultural consultant (2 wheels);Cane - single point;BSC/3in1      Prior Function Prior Level of Function : Needs assist       Physical Assist : Mobility (physical);ADLs (physical) Mobility (physical): Bed mobility;Transfers;Gait;Stairs   Mobility Comments: household ambulator using Rollator ADLs Comments: home aids M-F x 5 hours/day, 1/2 day on weekends, family can also assist     Hand Dominance        Extremity/Trunk Assessment   Upper Extremity Assessment Upper Extremity Assessment: Generalized weakness    Lower Extremity Assessment Lower Extremity Assessment: Generalized weakness    Cervical / Trunk Assessment Cervical / Trunk Assessment: Normal  Communication   Communication: HOH  Cognition Arousal/Alertness: Awake/alert Behavior During Therapy: WFL for tasks assessed/performed Overall Cognitive Status: Within Functional Limits for tasks assessed                                          General Comments      Exercises     Assessment/Plan    PT Assessment Patient needs continued PT services  PT Problem List Decreased strength;Decreased activity tolerance;Decreased balance;Decreased mobility       PT Treatment Interventions DME instruction;Gait training;Stair training;Functional mobility training;Therapeutic activities;Therapeutic exercise;Balance training;Patient/family education    PT Goals (Current goals can be found in the Care Plan section)  Acute Rehab PT Goals Patient Stated  Goal: return home with home aides to assist PT Goal Formulation: With patient Time For Goal Achievement: 01/16/23 Potential to Achieve Goals: Good    Frequency Min 3X/week     Co-evaluation               AM-PAC PT "6 Clicks" Mobility  Outcome Measure Help needed turning from your back to your side while in a flat bed without using bedrails?: None Help needed moving from lying on your back to sitting on the side of a flat bed without using bedrails?: A Little Help needed moving to and from a bed to a chair (including a wheelchair)?: A Lot Help needed standing up from a chair using your arms (e.g., wheelchair or bedside chair)?: A Lot Help needed to walk in hospital room?: A Lot Help needed climbing 3-5 steps with a railing? : Total 6 Click Score: 14    End of Session   Activity Tolerance: Patient tolerated treatment well;Patient limited by fatigue Patient left: in chair;with call bell/phone within reach Nurse Communication: Mobility status PT Visit Diagnosis: Unsteadiness on feet (R26.81);Other abnormalities of gait and mobility (R26.89);Muscle weakness (generalized) (M62.81)    Time: 1610-9604 PT Time Calculation (min) (ACUTE ONLY): 13 min   Charges:   PT Evaluation $PT Eval Low Complexity: 1 Low PT Treatments $Therapeutic Activity: 8-22  mins        3:35 PM, 01/02/23 Ocie Bob, MPT Physical Therapist with [facility] [phone] office [phone] mobile phone

## 2023-01-02 NOTE — Plan of Care (Signed)
  Problem: Acute Rehab PT Goals(only PT should resolve) Goal: Pt Will Go Supine/Side To Sit Outcome: Progressing Flowsheets (Taken 01/02/2023 1536) Pt will go Supine/Side to Sit: with min guard assist Goal: Patient Will Transfer Sit To/From Stand Outcome: Progressing Flowsheets (Taken 01/02/2023 1536) Patient will transfer sit to/from stand: with minimal assist Goal: Pt Will Transfer Bed To Chair/Chair To Bed Outcome: Progressing Flowsheets (Taken 01/02/2023 1536) Pt will Transfer Bed to Chair/Chair to Bed: with min assist Goal: Pt Will Ambulate Outcome: Progressing Flowsheets (Taken 01/02/2023 1536) Pt will Ambulate:  25 feet  with minimal assist  with rolling walker   3:37 PM, 01/02/23 Ocie Bob, MPT Physical Therapist with [facility] [phone] office [phone] mobile phone

## 2023-01-02 NOTE — Care Management Obs Status (Signed)
MEDICARE OBSERVATION STATUS NOTIFICATION   Patient Details  Name: Kendra White MRN: [id number] Date of Birth: 05-Dec-1930   Medicare Observation Status Notification Given:  Yes    Corey Harold 01/02/2023, 3:40 PM

## 2023-01-02 NOTE — Progress Notes (Signed)
Assisted to bedside commode, very weak and moved very slowly, stating she is usually not this weak and is weaker this morning.  Some incontinence of urine but did void a lot in [facility] and had bm.  Continuous hand tremors but can feed self.  Received bedbath and pericare with new purewick

## 2023-01-02 NOTE — Progress Notes (Signed)
Echo will be done tomorrow

## 2023-01-02 NOTE — Progress Notes (Addendum)
PROGRESS NOTE    Kendra White  GNF:[id number] DOB: [birth year] DOA: 01/01/2023 PCP: Joette Catching, MD   Brief Narrative:   Kendra White is a 89 y.o. female with medical history significant for diabetes mellitus, hypertension, dementia.  Patient presented to the ED with complaints of generalized weakness, and headache of about 1 week duration.  She reports over the past 2 days she is barely been able to ambulate.  Patient was admitted for generalized weakness in the setting of UTI as well as new onset atrial fibrillation.  Assessment & Plan:   Principal Problem:   Generalized weakness Active Problems:   Anxiety   UTI (urinary tract infection)   Left leg swelling   Tremor   New onset atrial fibrillation (HCC)  Assessment and Plan:   Generalized weakness Generalized weakness likely secondary to UTI.  UA suggestive with moderate leukocytes.  She reports urinary symptoms..  Rules out for sepsis.  Lives at home,  has sitter that stays with her during the week, on the weekends she is alone. -IV Ceftriaxone 1 g daily -Follow-up urine cultures pending - PT eval   New onset atrial fibrillation (HCC) Asymptomatic.  No documentation of prior irregular heart rhythm.  No echo on file.  Heart rate 80s.  Potassium 4.4.  QTc prolonged at 436 -Magnesium -Troponins x 2- 13 > 14. - Obtain echo -TSH 2.572 - Repeat EKG in the morning -Obtain advanced age, anticoagulation not started, will need to discuss risk and benefits of anticoagulation with patient and family   Left leg swelling - Left lower extremity venous Doppler negative for DVT   Anxiety History of dementia, anxiety.  Reports worsening tremors, twitching.   On several psychoactive agents, with QT prolongation -Resume Xanax, donepezil, memantine, primidone -Resume Celexa and bupropion for now   Obesity BMI 32.90   DVT prophylaxis:Lovenox Code Status: Full Family Communication: Daughter at bedside 6/17 Disposition Plan:   Status is: Observation The patient will require care spanning > 2 midnights and should be moved to inpatient because: Need for IV medication.   Consultants:  None  Procedures:  None  Antimicrobials:  Anti-infectives (From admission, onward)    Start     Dose/Rate Route Frequency Ordered Stop   01/02/23 1800  cefTRIAXone (ROCEPHIN) 1 g in sodium chloride 0.9 % 100 mL IVPB        1 g 200 mL/hr over 30 Minutes Intravenous Every 24 hours 01/01/23 2329     01/01/23 1830  cefTRIAXone (ROCEPHIN) 2 g in sodium chloride 0.9 % 100 mL IVPB        2 g 200 mL/hr over 30 Minutes Intravenous  Once 01/01/23 1829 01/01/23 2145         Subjective: Patient seen and evaluated today with no new acute complaints or concerns. No acute concerns or events noted overnight.  Objective: Vitals:   01/01/23 2014 01/01/23 2337 01/02/23 0502 01/02/23 0808  BP: (!) 165/63 (!) 164/76 (!) 156/70 (!) 171/78  Pulse: 85 78 76 95  Resp: 18 20 16 16   Temp: 98.4 F (36.9 C) 97.9 F (36.6 C) 98.6 F (37 C) 97.8 F (36.6 C)  TempSrc:   Oral   SpO2: 93% 96% 97% 95%  Weight:      Height:        Intake/Output Summary (Last 24 hours) at 01/02/2023 1220 Last data filed at 01/02/2023 0900 Gross per 24 hour  Intake 460 ml  Output 800 ml  Net -340 ml  Filed Weights   01/01/23 1221  Weight: 81.6 kg    Examination:  General exam: Appears calm and comfortable  Respiratory system: Clear to auscultation. Respiratory effort normal. Cardiovascular system: S1 & S2 heard, RRR.  Gastrointestinal system: Abdomen is soft Central nervous system: Alert and awake Extremities: No edema Skin: No significant lesions noted Psychiatry: Flat affect.    Data Reviewed: I have personally reviewed following labs and imaging studies  CBC: Recent Labs  Lab 01/01/23 1243  WBC 5.0  HGB 11.0*  HCT 33.7*  MCV 91.6  PLT 334   Basic Metabolic Panel: Recent Labs  Lab 01/01/23 1243 01/02/23 0354  NA 136  --   K  4.4  --   CL 104  --   CO2 21*  --   GLUCOSE 131*  --   BUN 30*  --   CREATININE 1.31*  --   CALCIUM 9.3  --   MG  --  1.7   GFR: Estimated Creatinine Clearance: 28.3 mL/min (A) (by C-G formula based on SCr of 1.31 mg/dL (H)). Liver Function Tests: Recent Labs  Lab 01/01/23 1246  AST 27  ALT 16  ALKPHOS 20*  BILITOT 0.7  PROT 6.8  ALBUMIN 3.7   No results for input(s): "LIPASE", "AMYLASE" in the last 168 hours. No results for input(s): "AMMONIA" in the last 168 hours. Coagulation Profile: No results for input(s): "INR", "PROTIME" in the last 168 hours. Cardiac Enzymes: No results for input(s): "CKTOTAL", "CKMB", "CKMBINDEX", "TROPONINI" in the last 168 hours. BNP (last 3 results) No results for input(s): "PROBNP" in the last 8760 hours. HbA1C: No results for input(s): "HGBA1C" in the last 72 hours. CBG: Recent Labs  Lab 01/01/23 1410  GLUCAP 121*   Lipid Profile: No results for input(s): "CHOL", "HDL", "LDLCALC", "TRIG", "CHOLHDL", "LDLDIRECT" in the last 72 hours. Thyroid Function Tests: Recent Labs    01/02/23 0354  TSH 2.572   Anemia Panel: No results for input(s): "VITAMINB12", "FOLATE", "FERRITIN", "TIBC", "IRON", "RETICCTPCT" in the last 72 hours. Sepsis Labs: No results for input(s): "PROCALCITON", "LATICACIDVEN" in the last 168 hours.  No results found for this or any previous visit (from the past 240 hour(s)).       Radiology Studies: US Venous Img Lower Unilateral Left (DVT)  Result Date: 01/02/2023 CLINICAL DATA:  [id number] Leg swelling 144481 EXAM: LEFT LOWER EXTREMITY VENOUS DOPPLER ULTRASOUND TECHNIQUE: Gray-scale sonography with compression, as well as color and duplex ultrasound, were performed to evaluate the deep venous system(s) from the level of the common femoral vein through the popliteal and proximal calf veins. COMPARISON:  Chest XR, 08/02/2022. FINDINGS: VENOUS Normal compressibility of the common femoral, superficial femoral, and  popliteal veins, as well as the visualized calf veins. Visualized portions of profunda femoral vein and great saphenous vein unremarkable. No filling defects to suggest DVT on grayscale or color Doppler imaging. Doppler waveforms show normal direction of venous flow, normal respiratory plasticity and response to augmentation. Limited views of the contralateral common femoral vein are unremarkable. OTHER No evidence of superficial thrombophlebitis. At the posterior LEFT knee within the fossa is a well-circumscribed anechoic and avascular fluid collection measuring approximately 2.7 x 3.7 x 1.0 cm, consistent with a popliteal fossa/Baker cyst. Limitations: none IMPRESSION: 1. No evidence of femoropopliteal DVT or superficial thrombophlebitis within the LEFT lower extremity. 2. 4 cm LEFT popliteal fossa/Baker cyst. Roanna Banning, MD [facility] Radiology Electronically Signed   By: Roanna Banning M.D.   On:  01/02/2023 10:38   DG Chest Portable 1 View  Result Date: 01/01/2023 CLINICAL DATA:  Altered mental status, weakness, headaches. EXAM: PORTABLE CHEST 1 VIEW COMPARISON:  Chest radiograph dated 09/16/2016. FINDINGS: The heart size is at the upper limits of normal given portable technique. Vascular calcifications are seen in the aortic arch. There is mild left basilar atelectasis/airspace disease. The right lung is clear. There is no pleural effusion or pneumothorax. The visualized skeletal structures are unremarkable. IMPRESSION: Mild left basilar atelectasis/airspace disease. Electronically Signed   By: Romona Curls M.D.   On: 01/01/2023 14:15   CT Head Wo Contrast  Result Date: 01/01/2023 CLINICAL DATA:  Mental status change. EXAM: CT HEAD WITHOUT CONTRAST TECHNIQUE: Contiguous axial images were obtained from the base of the skull through the vertex without intravenous contrast. RADIATION DOSE REDUCTION: This exam was performed according to the departmental  dose-optimization program which includes automated exposure control, adjustment of the mA and/or kV according to patient size and/or use of iterative reconstruction technique. COMPARISON:  CT maxillofacial 09/18/2016 FINDINGS: Brain: Moderate atrophy and white matter disease is again noted. No acute infarct, hemorrhage, or mass lesion is present. The ventricles are of normal size. Deep brain nuclei are within normal limits. No significant extraaxial fluid collection is present. The brainstem and cerebellum are within normal limits. Vascular: Atherosclerotic calcifications are present within the cavernous internal carotid arteries bilaterally. Calcifications are present the dural margin of the left vertebral artery. No hyperdense vessel is present. Skull: Calvarium is intact. No focal lytic or blastic lesions are present. No significant extracranial soft tissue lesion is present. Sinuses/Orbits: The paranasal sinuses and mastoid air cells are clear. Bilateral lens replacements are noted. Globes and orbits are otherwise unremarkable. IMPRESSION: 1. No acute intracranial abnormality or significant interval change. 2. Stable atrophy and white matter disease. This likely reflects the sequela of chronic microvascular ischemia. Electronically Signed   By: Marin Roberts M.D.   On: 01/01/2023 13:30        Scheduled Meds:  donepezil  5 mg Oral QHS   enoxaparin (LOVENOX) injection  30 mg Subcutaneous Q24H   losartan  50 mg Oral Daily   And   hydrochlorothiazide  12.5 mg Oral Daily   levothyroxine  50 mcg Oral Q0600   memantine  5 mg Oral BID   pantoprazole  40 mg Oral BID AC   primidone  50 mg Oral TID   rosuvastatin  40 mg Oral Daily   Continuous Infusions:  cefTRIAXone (ROCEPHIN)  IV       LOS: 0 days    Time spent: 35 minutes    Hamzah Savoca Hoover Brunette, DO [facility]  If 7PM-7AM, please contact night-coverage [email or web address] 01/02/2023, 12:20 PM

## 2023-01-02 NOTE — H&P (Signed)
Transition of Care [facility]) - Inpatient Brief Assessment   Patient Details  Name: Kendra White MRN: [id number] Date of Birth: [birth year]  Transition of Care [facility]) CM/SW Contact:    Annice Needy, LCSW Phone Number: 01/02/2023, 11:29 AM   Clinical Narrative: Transition of Care Department [facility]) has reviewed patient and no TOC needs have been identified at this time. We will continue to monitor patient advancement through interdisciplinary progression rounds. If new patient transition needs arise, please place a TOC consult.   Transition of Care Asessment: Insurance and Status: Insurance coverage has been reviewed Patient has primary care physician: Yes Home environment has been reviewed: from home alone. has private duty caregiver Prior level of function:: uses fww Prior/Current Home Services: Current home services (private duty caregiver) Social Determinants of Health Reivew: SDOH reviewed no interventions necessary Readmission risk has been reviewed: Yes Transition of care needs: no transition of care needs at this time

## 2023-01-03 ENCOUNTER — Observation Stay (HOSPITAL_BASED_OUTPATIENT_CLINIC_OR_DEPARTMENT_OTHER): Payer: Medicare Other

## 2023-01-03 ENCOUNTER — Encounter (HOSPITAL_COMMUNITY): Payer: Self-pay | Admitting: Internal Medicine

## 2023-01-03 DIAGNOSIS — R531 Weakness: Secondary | ICD-10-CM | POA: Diagnosis not present

## 2023-01-03 DIAGNOSIS — R9431 Abnormal electrocardiogram [ECG] [EKG]: Secondary | ICD-10-CM | POA: Diagnosis not present

## 2023-01-03 LAB — ECHOCARDIOGRAM COMPLETE
AR max vel: 1.97 cm2
AV Area VTI: 2.47 cm2
AV Area mean vel: 2.15 cm2
AV Mean grad: 5 mmHg
AV Peak grad: 8.4 mmHg
Ao pk vel: 1.45 m/s
Area-P 1/2: 1.71 cm2
Height: 62 in
MV VTI: 1.02 cm2
S' Lateral: 2.3 cm
Weight: 2880 oz

## 2023-01-03 LAB — URINE CULTURE

## 2023-01-03 NOTE — Progress Notes (Signed)
Mobility Specialist Progress Note:    01/03/23 0945  Mobility  Activity Transferred from bed to chair  Level of Assistance Moderate assist, patient does 50-74%  Assistive Device Front wheel walker  Distance Ambulated (ft) 3 ft  Range of Motion/Exercises Active;All extremities  Activity Response Tolerated well  Mobility Referral Yes  $Mobility charge 1 Mobility  Mobility Specialist Start Time (ACUTE ONLY) 0945  Mobility Specialist Stop Time (ACUTE ONLY) [id number]  Mobility Specialist Time Calculation (min) (ACUTE ONLY) 13 min   Pt received in bed, agreeable to mobility session. Required ModA to stand and pivot via RW. Tolerated well, pt experienced tremors throughout and had audible SOB.  Left pt in chair, all needs met.   Feliciana Rossetti Mobility Specialist Please contact via Special educational needs teacher or  Rehab office at [phone]

## 2023-01-03 NOTE — TOC Initial Note (Signed)
Transition of Care [facility]) - Initial/Assessment Note    Patient Details  Name: Kendra White MRN: [id number] Date of Birth: June 29, 1931  Transition of Care [facility]) CM/SW Contact:    Annice Needy, LCSW Phone Number: 01/03/2023, 1:10 PM  Clinical Narrative:                 Patient from home. Has caregiver and they have increased her hours. Her daughter lives beside her and she has an emergency button she can push if she is in distress. Patient recommended by PT for SNF. Family chooses home with HHPT. Her preference is [facility] because she has used them in the past.   Expected Discharge Plan: Home w Home Health Services Barriers to Discharge: Continued Medical Work up   Patient Goals and CMS Choice Patient states their goals for this hospitalization and ongoing recovery are:: home with [facility]          Expected Discharge Plan and Services     Post Acute Care Choice: Home Health Living arrangements for the past 2 months: Single Family Home                           HH Arranged: PT HH Agency: [facility]        Prior Living Arrangements/Services Living arrangements for the past 2 months: Single Family Home Lives with:: Self Patient language and need for interpreter reviewed:: Yes Do you feel safe going back to the place where you live?: Yes      Need for Family Participation in Patient Care: Yes (Comment) Care giver support system in place?: Yes (comment) Current home services: Homehealth aide Criminal Activity/Legal Involvement Pertinent to Current Situation/Hospitalization: No - Comment as needed  Activities of Daily Living Home Assistive Devices/Equipment: Environmental consultant (specify type), Shower chair with back, CBG Meter, Eyeglasses, Hearing aid, Scales ADL Screening (condition at time of admission) Patient's cognitive ability adequate to safely complete daily activities?: Yes Is the patient deaf or have difficulty hearing?: Yes Does the patient have difficulty seeing,  even when wearing glasses/contacts?: No Does the patient have difficulty concentrating, remembering, or making decisions?: No Patient able to express need for assistance with ADLs?: Yes Does the patient have difficulty dressing or bathing?: Yes Independently performs ADLs?: No Communication: Independent Dressing (OT): Needs assistance Is this a change from baseline?: Change from baseline, expected to last <3days Grooming: Needs assistance Is this a change from baseline?: Change from baseline, expected to last <3 days Feeding: Independent Bathing: Needs assistance Is this a change from baseline?: Change from baseline, expected to last <3 days Toileting: Needs assistance Is this a change from baseline?: Change from baseline, expected to last <3 days In/Out Bed: Needs assistance Is this a change from baseline?: Change from baseline, expected to last <3 days Walks in Home: Needs assistance Is this a change from baseline?: Change from baseline, expected to last <3 days Does the patient have difficulty walking or climbing stairs?: Yes Weakness of Legs: Both Weakness of Arms/Hands: Both  Permission Sought/Granted Permission sought to share information with : Family Supports    Share Information with NAME: Daughter, Alona Bene           Emotional Assessment       Orientation: : Oriented to Self, Oriented to Place, Oriented to  Time, Oriented to Situation Alcohol / Substance Use: Not Applicable Psych Involvement: No (comment)  Admission diagnosis:  Dehydration [E86.0] Weakness [R53.1] Generalized weakness [R53.1] Patient Active  Problem List   Diagnosis Date Noted   Generalized weakness 01/01/2023   UTI (urinary tract infection) 01/01/2023   Left leg swelling 01/01/2023   Tremor 01/01/2023   New onset atrial fibrillation (HCC) 01/01/2023   Gastric ulcer 03/31/2017   GIB (gastrointestinal bleeding) 03/30/2017   Adjustment disorder with mixed anxiety and depressed mood 12/28/2016    Major depressive disorder, recurrent episode, moderate (HCC) 12/07/2016   Hair loss 12/07/2016   Vascular dementia without behavioral disturbance (HCC) 11/11/2016   Dyslipidemia 11/11/2016   Panic disorder 10/27/2016   Need for pneumococcal vaccine 10/27/2016   Anxiety, generalized 10/27/2016   Coronary artery disease due to lipid rich plaque 10/27/2016   Acute sinusitis 09/18/2016   Eye redness 09/18/2016   Acute renal insufficiency 09/16/2016   Dehydration 09/16/2016   Hyponatremia 09/16/2016   Hypokalemia 09/16/2016   Cough 09/16/2016   Diabetes mellitus (HCC)    Anxiety    Essential hypertension 08/12/2016   Sciatica, left side 08/12/2016   PCP:  Joette Catching, MD Pharmacy:   [facility] [place], [place] - [address] [place] [address] Phone: [phone] Fax: [phone]     Social Determinants of Health (SDOH) Social History: SDOH Screenings   Food Insecurity: No Food Insecurity (01/01/2023)  Housing: Low Risk  (01/01/2023)  Transportation Needs: No Transportation Needs (01/01/2023)  Utilities: Not At Risk (01/01/2023)  Tobacco Use: Low Risk  (01/03/2023)   SDOH Interventions:     Readmission Risk Interventions     No data to display

## 2023-01-03 NOTE — Progress Notes (Signed)
*  PRELIMINARY RESULTS* Echocardiogram 2D Echocardiogram has been performed.  Stacey Drain 01/03/2023, 5:10 PM

## 2023-01-03 NOTE — Progress Notes (Signed)
PROGRESS NOTE    Kendra White  UJW:[id number] DOB: Mar 17, 1931 DOA: 01/01/2023 PCP: Joette Catching, MD   Brief Narrative:   Kendra White is a 89 y.o. female with medical history significant for diabetes mellitus, hypertension, dementia.  Patient presented to the ED with complaints of generalized weakness, and headache of about 1 week duration.  She reports over the past 2 days she is barely been able to ambulate.  Patient was admitted for generalized weakness in the setting of UTI as well as new onset atrial fibrillation.  Urine cultures and 2D echocardiogram currently pending.  PT recommending SNF, but family not interested and wanting to go home with home health.  Assessment & Plan:   Principal Problem:   Generalized weakness Active Problems:   Anxiety   UTI (urinary tract infection)   Left leg swelling   Tremor   New onset atrial fibrillation (HCC)  Assessment and Plan:   Generalized weakness Generalized weakness likely secondary to UTI.  UA suggestive with moderate leukocytes.  She reports urinary symptoms..  Rules out for sepsis.  Lives at home,  has sitter that stays with her during the week, on the weekends she is alone. -IV Ceftriaxone 1 g daily -Follow-up urine cultures pending - PT eval recommending SNF, but family members interested in home health   New onset atrial fibrillation (HCC) Asymptomatic.  No documentation of prior irregular heart rhythm.  No echo on file.  Heart rate 80s.  Potassium 4.4.  QTc prolonged at 436 -Magnesium -Troponins x 2- 13 > 14. - Obtain echo pending -TSH 2.572 -Obtain advanced age, anticoagulation not started, can follow up outpatient with cardiology to discuss   Left leg swelling - Left lower extremity venous Doppler negative for DVT   Anxiety History of dementia, anxiety.  Reports worsening tremors, twitching.   On several psychoactive agents, with QT prolongation -Resume Xanax, donepezil, memantine, primidone -Resume Celexa and  bupropion for now   Obesity BMI 32.90   DVT prophylaxis:Lovenox Code Status: Full Family Communication: Daughter on phone 6/18 Disposition Plan:  Status is: Observation The patient will require care spanning > 2 midnights and should be moved to inpatient because: Need for IV medications.    Consultants:  None  Procedures:  None  Antimicrobials:  Anti-infectives (From admission, onward)    Start     Dose/Rate Route Frequency Ordered Stop   01/02/23 1800  cefTRIAXone (ROCEPHIN) 1 g in sodium chloride 0.9 % 100 mL IVPB        1 g 200 mL/hr over 30 Minutes Intravenous Every 24 hours 01/01/23 2329     01/01/23 1830  cefTRIAXone (ROCEPHIN) 2 g in sodium chloride 0.9 % 100 mL IVPB        2 g 200 mL/hr over 30 Minutes Intravenous  Once 01/01/23 1829 01/01/23 2145         Subjective: Patient seen and evaluated today with no new acute complaints or concerns. No acute concerns or events noted overnight.  Objective: Vitals:   01/02/23 0808 01/02/23 1415 01/02/23 2131 01/03/23 0502  BP: (!) 171/78 (!) 161/73 (!) 158/75 (!) 160/65  Pulse: 95 81 87 75  Resp: 16 16 20 16   Temp: 97.8 F (36.6 C) 98.6 F (37 C) 98.9 F (37.2 C) 97.6 F (36.4 C)  TempSrc:   Oral Oral  SpO2: 95% 96% 99% 95%  Weight:      Height:        Intake/Output Summary (Last 24 hours) at 01/03/2023 1026  Last data filed at 01/03/2023 0900 Gross per 24 hour  Intake 240 ml  Output 2320 ml  Net -2080 ml   Filed Weights   01/01/23 1221  Weight: 81.6 kg    Examination:  General exam: Appears calm and comfortable  Respiratory system: Clear to auscultation. Respiratory effort normal. Cardiovascular system: S1 & S2 heard, RRR.  Gastrointestinal system: Abdomen is soft Central nervous system: Alert and awake Extremities: No edema Skin: No significant lesions noted Psychiatry: Flat affect.    Data Reviewed: I have personally reviewed following labs and imaging studies  CBC: Recent Labs  Lab  01/01/23 1243  WBC 5.0  HGB 11.0*  HCT 33.7*  MCV 91.6  PLT 334   Basic Metabolic Panel: Recent Labs  Lab 01/01/23 1243 01/02/23 0354  NA 136  --   K 4.4  --   CL 104  --   CO2 21*  --   GLUCOSE 131*  --   BUN 30*  --   CREATININE 1.31*  --   CALCIUM 9.3  --   MG  --  1.7   GFR: Estimated Creatinine Clearance: 28.3 mL/min (A) (by C-G formula based on SCr of 1.31 mg/dL (H)). Liver Function Tests: Recent Labs  Lab 01/01/23 1246  AST 27  ALT 16  ALKPHOS 20*  BILITOT 0.7  PROT 6.8  ALBUMIN 3.7   No results for input(s): "LIPASE", "AMYLASE" in the last 168 hours. No results for input(s): "AMMONIA" in the last 168 hours. Coagulation Profile: No results for input(s): "INR", "PROTIME" in the last 168 hours. Cardiac Enzymes: No results for input(s): "CKTOTAL", "CKMB", "CKMBINDEX", "TROPONINI" in the last 168 hours. BNP (last 3 results) No results for input(s): "PROBNP" in the last 8760 hours. HbA1C: No results for input(s): "HGBA1C" in the last 72 hours. CBG: Recent Labs  Lab 01/01/23 1410  GLUCAP 121*   Lipid Profile: No results for input(s): "CHOL", "HDL", "LDLCALC", "TRIG", "CHOLHDL", "LDLDIRECT" in the last 72 hours. Thyroid Function Tests: Recent Labs    01/02/23 0354  TSH 2.572   Anemia Panel: No results for input(s): "VITAMINB12", "FOLATE", "FERRITIN", "TIBC", "IRON", "RETICCTPCT" in the last 72 hours. Sepsis Labs: No results for input(s): "PROCALCITON", "LATICACIDVEN" in the last 168 hours.  No results found for this or any previous visit (from the past 240 hour(s)).       Radiology Studies: US Venous Img Lower Unilateral Left (DVT)  Result Date: 01/02/2023 CLINICAL DATA:  [id number] Leg swelling [id number] EXAM: LEFT LOWER EXTREMITY VENOUS DOPPLER ULTRASOUND TECHNIQUE: Gray-scale sonography with compression, as well as color and duplex ultrasound, were performed to evaluate the deep venous system(s) from the level of the common femoral vein through  the popliteal and proximal calf veins. COMPARISON:  Chest XR, 08/02/2022. FINDINGS: VENOUS Normal compressibility of the common femoral, superficial femoral, and popliteal veins, as well as the visualized calf veins. Visualized portions of profunda femoral vein and great saphenous vein unremarkable. No filling defects to suggest DVT on grayscale or color Doppler imaging. Doppler waveforms show normal direction of venous flow, normal respiratory plasticity and response to augmentation. Limited views of the contralateral common femoral vein are unremarkable. OTHER No evidence of superficial thrombophlebitis. At the posterior LEFT knee within the fossa is a well-circumscribed anechoic and avascular fluid collection measuring approximately 2.7 x 3.7 x 1.0 cm, consistent with a popliteal fossa/Baker cyst. Limitations: none IMPRESSION: 1. No evidence of femoropopliteal DVT or superficial thrombophlebitis within the LEFT lower extremity. 2. 4 cm LEFT popliteal  fossa/Baker cyst. Roanna Banning, MD [facility] Electronically Signed   By: Roanna Banning M.D.   On: 01/02/2023 10:38   DG Chest Portable 1 View  Result Date: 01/01/2023 CLINICAL DATA:  Altered mental status, weakness, headaches. EXAM: PORTABLE CHEST 1 VIEW COMPARISON:  Chest radiograph dated 09/16/2016. FINDINGS: The heart size is at the upper limits of normal given portable technique. Vascular calcifications are seen in the aortic arch. There is mild left basilar atelectasis/airspace disease. The right lung is clear. There is no pleural effusion or pneumothorax. The visualized skeletal structures are unremarkable. IMPRESSION: Mild left basilar atelectasis/airspace disease. Electronically Signed   By: Romona Curls M.D.   On: 01/01/2023 14:15   CT Head Wo Contrast  Result Date: 01/01/2023 CLINICAL DATA:  Mental status change. EXAM: CT HEAD WITHOUT CONTRAST TECHNIQUE: Contiguous axial images were  obtained from the base of the skull through the vertex without intravenous contrast. RADIATION DOSE REDUCTION: This exam was performed according to the departmental dose-optimization program which includes automated exposure control, adjustment of the mA and/or kV according to patient size and/or use of iterative reconstruction technique. COMPARISON:  CT maxillofacial 09/18/2016 FINDINGS: Brain: Moderate atrophy and white matter disease is again noted. No acute infarct, hemorrhage, or mass lesion is present. The ventricles are of normal size. Deep brain nuclei are within normal limits. No significant extraaxial fluid collection is present. The brainstem and cerebellum are within normal limits. Vascular: Atherosclerotic calcifications are present within the cavernous internal carotid arteries bilaterally. Calcifications are present the dural margin of the left vertebral artery. No hyperdense vessel is present. Skull: Calvarium is intact. No focal lytic or blastic lesions are present. No significant extracranial soft tissue lesion is present. Sinuses/Orbits: The paranasal sinuses and mastoid air cells are clear. Bilateral lens replacements are noted. Globes and orbits are otherwise unremarkable. IMPRESSION: 1. No acute intracranial abnormality or significant interval change. 2. Stable atrophy and white matter disease. This likely reflects the sequela of chronic microvascular ischemia. Electronically Signed   By: Marin Roberts M.D.   On: 01/01/2023 13:30        Scheduled Meds:  buPROPion  300 mg Oral q morning   citalopram  40 mg Oral Daily   donepezil  5 mg Oral QHS   enoxaparin (LOVENOX) injection  30 mg Subcutaneous Q24H   losartan  50 mg Oral Daily   And   hydrochlorothiazide  12.5 mg Oral Daily   levothyroxine  50 mcg Oral Q0600   memantine  5 mg Oral BID   pantoprazole  40 mg Oral BID AC   primidone  50 mg Oral TID   rosuvastatin  40 mg Oral Daily   Continuous Infusions:  cefTRIAXone  (ROCEPHIN)  IV 1 g (01/02/23 1751)     LOS: 0 days    Time spent: 35 minutes    Jamell Opfer Hoover Brunette, DO [facility]  If 7PM-7AM, please contact night-coverage [email or web address] 01/03/2023, 10:26 AM

## 2023-01-04 DIAGNOSIS — E860 Dehydration: Secondary | ICD-10-CM

## 2023-01-04 DIAGNOSIS — R251 Tremor, unspecified: Secondary | ICD-10-CM | POA: Diagnosis not present

## 2023-01-04 DIAGNOSIS — R531 Weakness: Secondary | ICD-10-CM | POA: Diagnosis not present

## 2023-01-04 DIAGNOSIS — N3000 Acute cystitis without hematuria: Secondary | ICD-10-CM | POA: Diagnosis not present

## 2023-01-04 DIAGNOSIS — F419 Anxiety disorder, unspecified: Secondary | ICD-10-CM | POA: Diagnosis not present

## 2023-01-04 LAB — CBC
HCT: 35 % — ABNORMAL LOW (ref 36.0–46.0)
Hemoglobin: 11.4 g/dL — ABNORMAL LOW (ref 12.0–15.0)
MCH: 29.6 pg (ref 26.0–34.0)
MCHC: 32.6 g/dL (ref 30.0–36.0)
MCV: 90.9 fL (ref 80.0–100.0)
Platelets: 334 10*3/uL (ref 150–400)
RBC: 3.85 MIL/uL — ABNORMAL LOW (ref 3.87–5.11)
RDW: 14.8 % (ref 11.5–15.5)
WBC: 6.2 10*3/uL (ref 4.0–10.5)
nRBC: 0 % (ref 0.0–0.2)

## 2023-01-04 LAB — BASIC METABOLIC PANEL
Anion gap: 11 (ref 5–15)
BUN: 41 mg/dL — ABNORMAL HIGH (ref 8–23)
CO2: 21 mmol/L — ABNORMAL LOW (ref 22–32)
Calcium: 9.3 mg/dL (ref 8.9–10.3)
Chloride: 100 mmol/L (ref 98–111)
Creatinine, Ser: 1.39 mg/dL — ABNORMAL HIGH (ref 0.44–1.00)
GFR, Estimated: 36 mL/min — ABNORMAL LOW (ref >60)
Glucose, Bld: 133 mg/dL — ABNORMAL HIGH (ref 70–99)
Potassium: 4 mmol/L (ref 3.5–5.1)
Sodium: 132 mmol/L — ABNORMAL LOW (ref 135–145)

## 2023-01-04 LAB — MAGNESIUM: Magnesium: 1.8 mg/dL (ref 1.7–2.4)

## 2023-01-04 MED ORDER — CEFDINIR 300 MG PO CAPS: 300.0000 mg | ORAL_CAPSULE | Freq: Two times a day (BID) | ORAL | 0 refills | Status: AC

## 2023-01-04 NOTE — Progress Notes (Signed)
Mobility Specialist Progress Note:   01/04/23 1202  Mobility  Activity Transferred from bed to chair  Level of Assistance Moderate assist, patient does 50-74%  Assistive Device Front wheel walker  Distance Ambulated (ft) 3 ft  Range of Motion/Exercises Active;All extremities  Activity Response Tolerated well  Mobility Referral Yes  $Mobility charge 1 Mobility  Mobility Specialist Start Time (ACUTE ONLY) 1148  Mobility Specialist Stop Time (ACUTE ONLY) 1158  Mobility Specialist Time Calculation (min) (ACUTE ONLY) 10 min   Pt agreeable to mobility session, received in bed. Transferred B>C using RW. ModA required to stand, MinA to pivot. Tolerated well, asx throughout. Family friend in room. Left pt in chair, alarm on, all needs met.  Feliciana Rossetti Mobility Specialist Please contact via Special educational needs teacher or  Rehab office at [phone]

## 2023-01-04 NOTE — Progress Notes (Signed)
Patient discharged home with Home with [facility].Discharge instructions given on medications and follow up visits,patient and family verbalized understanding .Prescriptions sent to Pharmacy of choice documented on AVS. IV discontinued,catheter. Accompanied by staff to an awaiting vehicle.

## 2023-01-04 NOTE — Discharge Summary (Signed)
Physician Discharge Summary   Patient: Kendra White MRN: [id number] DOB: 12-Nov-1930  Admit date:     01/01/2023  Discharge date: 01/04/23  Discharge Physician: Vassie Loll   PCP: Joette Catching, MD   Recommendations at discharge:  Arrange outpatient follow-up with cardiology service for further evaluation and final decision regarding anticoagulation due to paroxysmal atrial fibrillation. Repeat basic metabolic panel to follow electrolytes and renal function Reassess blood pressure and adjust antihypertensive treatment as needed  Discharge Diagnoses: Principal Problem:   Generalized weakness Active Problems:   Anxiety   UTI (urinary tract infection)   Left leg swelling   Tremor   New onset atrial fibrillation [facility])   Brief admission Hospital narrative: As per H&P written by Dr. Mariea Clonts on 01/01/2023 Kendra White is a 89 y.o. female with medical history significant for diabetes mellitus, hypertension, dementia.  Patient presented to the ED with complaints of generalized weakness, and headache of about 1 week duration.  She reports over the past 2 days she is barely been able to ambulate.  She has a Comptroller that with her during the week, but over the weekend she is alone.  Reports urinary symptoms of frequency over the past few weeks.  Reports swelling to her left lower extremity also over the past several days.  No pain to her left lower extremity.  No chest pain no difficulty breathing.  No cough no fevers.  She reports tremors to her mouth, and jerking of her left upper extremity that is worse than baseline.    ED Course: Stable vitals.  WBC 5.  Troponin 13 > 14.  Chest x-ray shows mild left atelectasis/airspace disease.  CT negative for acute abnormality.  UA- mod leukocytes rare bacteria.  EKG shows atrial fibrillation. IV ceftriaxone started. 1 L bolus given. Admission for UTI, generalized weakness.  Assessment and Plan: Generalized weakness Generalized weakness likely  secondary to UTI.  UA suggestive with moderate leukocytes.  She reports urinary symptoms..  -Patient Ruled out for sepsis.  Lives at home,  has sitter that stays with her during the week, on the weekends she is alone with family assistance. -2 more days of oral antibiotics anticipated discharge to complete therapy. -culture without any growth; patient afebrile and with neg dysuria. - PT eval recommending SNF, but family members interested in pursuing home health instead. -patient discharge home in stable conditions with [facility] services arranged.   New onset paroxysmal atrial fibrillation (HCC) -Asymptomatic.  No documentation of prior irregular heart rhythm.   -Stable 2D echo with preserved ejection fraction and no wall motion abnormality; no significant valvular disorder appreciated. Normal magnesium and potassium appreciated. -Magnesium -TSH 2.572 -Given advanced age, anticoagulation not started, can follow up outpatient with cardiology to further discuss. -Patient also with prior history of gastric ulcers and bleeding.   Left leg swelling - Left lower extremity venous Doppler negative for DVT -Advised to keep leg elevated as much as possible and to follow low-sodium diet. -Outpatient TED hoses usage can be entertained.   Anxiety -History of dementia, anxiety.  Reports worsening tremors, twitching.   On several psychoactive agents, which appears to help controlling condition. -Resume Xanax, donepezil, memantine, primidone, Celexa and bupropion. -Continue patient follow-up with PCP -Stable mood.   Obesity -BMI 32.92 -Portion control and low calorie diet discussed with patient.   Procedures performed: No for x-ray reports. Disposition: Home health Diet recommendation: Heart healthy diet.  DISCHARGE MEDICATION: Allergies as of 01/04/2023       Reactions  Atorvastatin    Other reaction(s): Constipation   Diphenhydramine Hcl Rash   Rosuvastatin    Other reaction(s): Constipation         Medication List     TAKE these medications    acetaminophen 325 MG tablet Commonly known as: TYLENOL Take 325-650 mg by mouth daily as needed for mild pain or moderate pain. For pain   ALPRAZolam 0.25 MG tablet Commonly known as: XANAX Take 0.125-0.25 mg by mouth 3 (three) times daily as needed for anxiety.   Biotin 5000 MCG Caps Take by mouth daily.   buPROPion 300 MG 24 hr tablet Commonly known as: WELLBUTRIN XL Take 300 mg by mouth every morning.   cefdinir 300 MG capsule Commonly known as: OMNICEF Take 1 capsule (300 mg total) by mouth 2 (two) times daily for 2 days.   citalopram 40 MG tablet Commonly known as: CELEXA Take 40 mg by mouth daily.   donepezil 5 MG tablet Commonly known as: ARICEPT Take 5 mg at bedtime by mouth.   fenofibrate 160 MG tablet Take 160 mg by mouth daily.   Fish Oil 1000 MG Caps Take by mouth.   fluticasone 50 MCG/ACT nasal spray Commonly known as: FLONASE Place 2 sprays into both nostrils daily.   Garlic 1000 MG Caps Take by mouth.   levothyroxine 50 MCG tablet Commonly known as: SYNTHROID Take 50 mcg by mouth daily.   losartan-hydrochlorothiazide 50-12.5 MG tablet Commonly known as: HYZAAR Take 1 tablet by mouth daily.   memantine 5 MG tablet Commonly known as: NAMENDA Take 5 mg by mouth 2 (two) times daily.   metFORMIN 500 MG tablet Commonly known as: GLUCOPHAGE Take 500 mg by mouth at bedtime.   multivitamin with minerals tablet Take 1 tablet by mouth daily.   pantoprazole 40 MG tablet Commonly known as: Protonix Take 1 tablet (40 mg total) by mouth 2 (two) times daily before a meal.   primidone 50 MG tablet Commonly known as: MYSOLINE Take 50 mg by mouth 3 (three) times daily.   rosuvastatin 40 MG tablet Commonly known as: CRESTOR Take 40 mg by mouth daily.   tretinoin 0.05 % cream Commonly known as: RETIN-A Apply topically.        Follow-up Information     Joette Catching, MD. Schedule  an appointment as soon as possible for a visit in 2 week(s).   Specialty: Family Medicine Contact information: [address] [place] [address] [phone]                Discharge Exam: Ceasar Mons Weights   01/01/23 1221  Weight: 81.6 kg   General exam: Alert, awake, oriented x 3; complaining of generalized weakness and deconditioning.  No fever, no chest pain, no nausea, no vomiting and feeling ready to go home. Respiratory system: Clear to auscultation. Respiratory effort normal.  Good saturation on room air. Cardiovascular system:RRR. No rubs or; no JVD. Gastrointestinal system: Abdomen is nondistended, soft and nontender. No organomegaly or masses felt. Normal bowel sounds heard. Central nervous system: Alert and oriented. No focal neurological deficits. Extremities: No cyanosis or clubbing; trace to 1+ edema appreciated in her lower extremities. Skin: No petechiae. Psychiatry: Judgement and insight appear normal.   Condition at discharge: stable and improved.  The results of significant diagnostics from this hospitalization (including imaging, microbiology, ancillary and laboratory) are listed below for reference.   Imaging Studies: ECHOCARDIOGRAM COMPLETE  Result Date: 01/03/2023    ECHOCARDIOGRAM REPORT   Patient Name:   Clarrisa  M Khalid Date of Exam: 01/03/2023 Medical Rec #:  [id number]      Height:       62.0 in Accession #:    [id number]     Weight:       180.0 lb Date of Birth:  [birth year]       BSA:          1.828 m Patient Age:    89 years       BP:           171/78 mmHg Patient Gender: F              HR:           95 bpm. [facility] Procedure: 2D Echo, Cardiac Doppler and Color Doppler Indications:    Abnormal EKG R94.31  History:        Patient has no prior history of Echocardiogram examinations.                 TIA, Arrythmias:Atrial Fibrillation; Risk Factors:Hypertension,                 Diabetes and Dyslipidemia.  Sonographer:    Celesta Gentile  RCS Referring Phys: [id number] PRATIK D [facility] IMPRESSIONS  1. Left ventricular ejection fraction, by estimation, is 65 to 70%. The left ventricle has normal function. The left ventricle has no regional wall motion abnormalities. Left ventricular diastolic parameters are indeterminate.  2. Right ventricular systolic function is normal. The right ventricular size is normal. Tricuspid regurgitation signal is inadequate for assessing PA pressure.  3. Left atrial size was moderately dilated.  4. The mitral valve is degenerative. Mild mitral valve regurgitation. Moderate mitral stenosis. The mean mitral valve gradient is 6.0 mmHg. Average heart rate 80 bpm. Severe mitral annular calcification.  5. The aortic valve is functionally bicuspid. There is moderate calcification of the aortic valve. Aortic valve regurgitation is not visualized. Aortic valve sclerosis/calcification is present, without any evidence of aortic stenosis. Aortic valve mean gradient measures 5.0 mmHg.  6. The inferior vena cava is normal in size with greater than 50% respiratory variability, suggesting right atrial pressure of 3 mmHg. Comparison(s): No prior Echocardiogram. FINDINGS  Left Ventricle: Left ventricular ejection fraction, by estimation, is 65 to 70%. The left ventricle has normal function. The left ventricle has no regional wall motion abnormalities. The left ventricular internal cavity size was normal in size. There is  borderline concentric left ventricular hypertrophy. Left ventricular diastolic function could not be evaluated due to mitral annular calcification (moderate or greater). Left ventricular diastolic parameters are indeterminate. Right Ventricle: The right ventricular size is normal. No increase in right ventricular wall thickness. Right ventricular systolic function is normal. Tricuspid regurgitation signal is inadequate for assessing PA pressure. Left Atrium: Left atrial size was moderately dilated. Right Atrium: Right atrial  size was normal in size. Pericardium: There is no evidence of pericardial effusion. Mitral Valve: The mitral valve is degenerative in appearance. Severe mitral annular calcification. Mild mitral valve regurgitation. Moderate mitral valve stenosis. MV peak gradient, 15.8 mmHg. The mean mitral valve gradient is 6.0 mmHg. Tricuspid Valve: The tricuspid valve is grossly normal. Tricuspid valve regurgitation is trivial. Aortic Valve: The aortic valve is bicuspid. There is moderate calcification of the aortic valve. There is mild aortic valve annular calcification. Aortic valve regurgitation is not visualized. Aortic valve sclerosis/calcification is present, without any evidence of aortic stenosis. Aortic valve mean gradient measures 5.0 mmHg. Aortic valve peak gradient measures  8.4 mmHg. Aortic valve area, by VTI measures 2.47 cm. Pulmonic Valve: The pulmonic valve was grossly normal. Pulmonic valve regurgitation is trivial. Aorta: The aortic root is normal in size and structure. Venous: The inferior vena cava is normal in size with greater than 50% respiratory variability, suggesting right atrial pressure of 3 mmHg. IAS/Shunts: No atrial level shunt detected by color flow Doppler.  LEFT VENTRICLE PLAX 2D LVIDd:         3.60 cm   Diastology LVIDs:         2.30 cm   LV e' medial:    4.35 cm/s LV PW:         1.10 cm   LV E/e' medial:  37.5 LV IVS:        1.00 cm   LV e' lateral:   5.55 cm/s LVOT diam:     1.80 cm   LV E/e' lateral: 29.4 LV SV:         72 LV SV Index:   39 LVOT Area:     2.54 cm  RIGHT VENTRICLE RV S prime:     12.40 cm/s TAPSE (M-mode): 2.1 cm LEFT ATRIUM           Index        RIGHT ATRIUM           Index LA diam:      3.20 cm 1.75 cm/m   RA Area:     19.20 cm LA Vol (A2C): 88.4 ml 48.36 ml/m  RA Volume:   50.50 ml  27.63 ml/m LA Vol (A4C): 60.3 ml 32.99 ml/m  AORTIC VALVE AV Area (Vmax):    1.97 cm AV Area (Vmean):   2.15 cm AV Area (VTI):     2.47 cm AV Vmax:           145.00 cm/s AV Vmean:           105.000 cm/s AV VTI:            0.292 m AV Peak Grad:      8.4 mmHg AV Mean Grad:      5.0 mmHg LVOT Vmax:         112.00 cm/s LVOT Vmean:        88.800 cm/s LVOT VTI:          0.283 m LVOT/AV VTI ratio: 0.97  AORTA Ao Root diam: 3.60 cm MITRAL VALVE MV Area (PHT): 1.71 cm     SHUNTS MV Area VTI:   1.02 cm     Systemic VTI:  0.28 m MV Peak grad:  15.8 mmHg    Systemic Diam: 1.80 cm MV Mean grad:  6.0 mmHg MV Vmax:       1.99 m/s MV Vmean:      108.0 cm/s MV Decel Time: 444 msec MV E velocity: 163.00 cm/s MV A velocity: 147.00 cm/s MV E/A ratio:  1.11 Nona Dell MD Electronically signed by Nona Dell MD Signature Date/Time: 01/03/2023/8:25:54 PM    Final    US Venous Img Lower Unilateral Left (DVT)  Result Date: 01/02/2023 CLINICAL DATA:  144481 Leg swelling 144481 EXAM: LEFT LOWER EXTREMITY VENOUS DOPPLER ULTRASOUND TECHNIQUE: Gray-scale sonography with compression, as well as color and duplex ultrasound, were performed to evaluate the deep venous system(s) from the level of the common femoral vein through the popliteal and proximal calf veins. COMPARISON:  Chest XR, 08/02/2022. FINDINGS: VENOUS Normal compressibility of the common femoral, superficial femoral, and popliteal veins, as well  as the visualized calf veins. Visualized portions of profunda femoral vein and great saphenous vein unremarkable. No filling defects to suggest DVT on grayscale or color Doppler imaging. Doppler waveforms show normal direction of venous flow, normal respiratory plasticity and response to augmentation. Limited views of the contralateral common femoral vein are unremarkable. OTHER No evidence of superficial thrombophlebitis. At the posterior LEFT knee within the fossa is a well-circumscribed anechoic and avascular fluid collection measuring approximately 2.7 x 3.7 x 1.0 cm, consistent with a popliteal fossa/Baker cyst. Limitations: none IMPRESSION: 1. No evidence of femoropopliteal DVT or superficial  thrombophlebitis within the LEFT lower extremity. 2. 4 cm LEFT popliteal fossa/Baker cyst. Roanna Banning, MD [facility] Radiology Electronically Signed   By: Roanna Banning M.D.   On: 01/02/2023 10:38   DG Chest Portable 1 View  Result Date: 01/01/2023 CLINICAL DATA:  Altered mental status, weakness, headaches. EXAM: PORTABLE CHEST 1 VIEW COMPARISON:  Chest radiograph dated 09/16/2016. FINDINGS: The heart size is at the upper limits of normal given portable technique. Vascular calcifications are seen in the aortic arch. There is mild left basilar atelectasis/airspace disease. The right lung is clear. There is no pleural effusion or pneumothorax. The visualized skeletal structures are unremarkable. IMPRESSION: Mild left basilar atelectasis/airspace disease. Electronically Signed   By: Romona Curls M.D.   On: 01/01/2023 14:15   CT Head Wo Contrast  Result Date: 01/01/2023 CLINICAL DATA:  Mental status change. EXAM: CT HEAD WITHOUT CONTRAST TECHNIQUE: Contiguous axial images were obtained from the base of the skull through the vertex without intravenous contrast. RADIATION DOSE REDUCTION: This exam was performed according to the departmental dose-optimization program which includes automated exposure control, adjustment of the mA and/or kV according to patient size and/or use of iterative reconstruction technique. COMPARISON:  CT maxillofacial 09/18/2016 FINDINGS: Brain: Moderate atrophy and white matter disease is again noted. No acute infarct, hemorrhage, or mass lesion is present. The ventricles are of normal size. Deep brain nuclei are within normal limits. No significant extraaxial fluid collection is present. The brainstem and cerebellum are within normal limits. Vascular: Atherosclerotic calcifications are present within the cavernous internal carotid arteries bilaterally. Calcifications are present the dural margin of the left vertebral artery. No  hyperdense vessel is present. Skull: Calvarium is intact. No focal lytic or blastic lesions are present. No significant extracranial soft tissue lesion is present. Sinuses/Orbits: The paranasal sinuses and mastoid air cells are clear. Bilateral lens replacements are noted. Globes and orbits are otherwise unremarkable. IMPRESSION: 1. No acute intracranial abnormality or significant interval change. 2. Stable atrophy and white matter disease. This likely reflects the sequela of chronic microvascular ischemia. Electronically Signed   By: Marin Roberts M.D.   On: 01/01/2023 13:30    Microbiology: Results for orders placed or performed during the hospital encounter of 01/01/23  Urine Culture (for pregnant, neutropenic or urologic patients or patients with an indwelling urinary catheter)     Status: Abnormal   Collection Time: 01/01/23  5:11 PM   Specimen: Urine, Clean Catch  Result Value Ref Range Status   Specimen Description   Final    URINE, CLEAN CATCH Performed at [facility], [address], [place], [place] [address]    Special Requests   Final    NONE Performed at [facility], [address], [place], [place] [address]    Culture MULTIPLE SPECIES PRESENT, SUGGEST RECOLLECTION (A)  Final   Report Status 01/03/2023 FINAL  Final    Labs:  CBC: Recent Labs  Lab 01/01/23 1243 01/04/23 0539  WBC 5.0 6.2  HGB 11.0* 11.4*  HCT 33.7* 35.0*  MCV 91.6 90.9  PLT 334 334   Basic Metabolic Panel: Recent Labs  Lab 01/01/23 1243 01/02/23 0354 01/04/23 0539  NA 136  --  132*  K 4.4  --  4.0  CL 104  --  100  CO2 21*  --  21*  GLUCOSE 131*  --  133*  BUN 30*  --  41*  CREATININE 1.31*  --  1.39*  CALCIUM 9.3  --  9.3  MG  --  1.7 1.8   Liver Function Tests: Recent Labs  Lab 01/01/23 1246  AST 27  ALT 16  ALKPHOS 20*  BILITOT 0.7  PROT 6.8  ALBUMIN 3.7   CBG: Recent Labs  Lab 01/01/23 1410  GLUCAP 121*    Discharge time spent: greater than 30  minutes.  Signed: Vassie Loll, MD [facility] 01/04/2023

## 2023-02-02 ENCOUNTER — Encounter (HOSPITAL_COMMUNITY): Payer: Self-pay | Admitting: *Deleted

## 2023-02-02 ENCOUNTER — Emergency Department (HOSPITAL_COMMUNITY)
Admission: EM | Admit: 2023-02-02 | Discharge: 2023-02-02 | Disposition: A | Discharge status: Home / Self Care | Payer: Medicare Other | Attending: Emergency Medicine | Admitting: Emergency Medicine

## 2023-02-02 ENCOUNTER — Other Ambulatory Visit: Payer: Self-pay

## 2023-02-02 DIAGNOSIS — Z7984 Long term (current) use of oral hypoglycemic drugs: Secondary | ICD-10-CM | POA: Diagnosis not present

## 2023-02-02 DIAGNOSIS — R0602 Shortness of breath: Secondary | ICD-10-CM | POA: Insufficient documentation

## 2023-02-02 DIAGNOSIS — E119 Type 2 diabetes mellitus without complications: Secondary | ICD-10-CM | POA: Insufficient documentation

## 2023-02-02 DIAGNOSIS — R531 Weakness: Secondary | ICD-10-CM | POA: Diagnosis present

## 2023-02-02 DIAGNOSIS — R251 Tremor, unspecified: Secondary | ICD-10-CM | POA: Insufficient documentation

## 2023-02-02 LAB — COMPREHENSIVE METABOLIC PANEL
ALT: 16 U/L (ref 0–44)
AST: 25 U/L (ref 15–41)
Albumin: 3.9 g/dL (ref 3.5–5.0)
Alkaline Phosphatase: 20 U/L — ABNORMAL LOW (ref 38–126)
Anion gap: 9 (ref 5–15)
BUN: 36 mg/dL — ABNORMAL HIGH (ref 8–23)
CO2: 23 mmol/L (ref 22–32)
Calcium: 9.6 mg/dL (ref 8.9–10.3)
Chloride: 102 mmol/L (ref 98–111)
Creatinine, Ser: 1.52 mg/dL — ABNORMAL HIGH (ref 0.44–1.00)
GFR, Estimated: 32 mL/min — ABNORMAL LOW (ref >60)
Glucose, Bld: 98 mg/dL (ref 70–99)
Potassium: 4.4 mmol/L (ref 3.5–5.1)
Sodium: 134 mmol/L — ABNORMAL LOW (ref 135–145)
Total Bilirubin: 0.5 mg/dL (ref 0.3–1.2)
Total Protein: 7 g/dL (ref 6.5–8.1)

## 2023-02-02 LAB — URINALYSIS, ROUTINE W REFLEX MICROSCOPIC
Bilirubin Urine: NEGATIVE
Glucose, UA: NEGATIVE mg/dL
Hgb urine dipstick: NEGATIVE
Ketones, ur: NEGATIVE mg/dL
Leukocytes,Ua: NEGATIVE
Nitrite: NEGATIVE
Protein, ur: NEGATIVE mg/dL
Specific Gravity, Urine: 1.016 (ref 1.005–1.030)
pH: 7 (ref 5.0–8.0)

## 2023-02-02 LAB — CBC WITH DIFFERENTIAL/PLATELET
Abs Immature Granulocytes: 0.02 10*3/uL (ref 0.00–0.07)
Basophils Absolute: 0.1 10*3/uL (ref 0.0–0.1)
Basophils Relative: 1 %
Eosinophils Absolute: 0.1 10*3/uL (ref 0.0–0.5)
Eosinophils Relative: 3 %
HCT: 33.6 % — ABNORMAL LOW (ref 36.0–46.0)
Hemoglobin: 11.1 g/dL — ABNORMAL LOW (ref 12.0–15.0)
Immature Granulocytes: 0 %
Lymphocytes Relative: 29 %
Lymphs Abs: 1.4 10*3/uL (ref 0.7–4.0)
MCH: 29.9 pg (ref 26.0–34.0)
MCHC: 33 g/dL (ref 30.0–36.0)
MCV: 90.6 fL (ref 80.0–100.0)
Monocytes Absolute: 0.7 10*3/uL (ref 0.1–1.0)
Monocytes Relative: 15 %
Neutro Abs: 2.4 10*3/uL (ref 1.7–7.7)
Neutrophils Relative %: 52 %
Platelets: 317 10*3/uL (ref 150–400)
RBC: 3.71 MIL/uL — ABNORMAL LOW (ref 3.87–5.11)
RDW: 14.8 % (ref 11.5–15.5)
WBC: 4.7 10*3/uL (ref 4.0–10.5)
nRBC: 0 % (ref 0.0–0.2)

## 2023-02-02 MED ORDER — HYDROXYZINE HCL 25 MG PO TABS: ORAL_TABLET | ORAL | 0 refills | Status: AC

## 2023-02-02 NOTE — ED Triage Notes (Addendum)
Pt with c/o SOB since 6/16 and tremors x 3 years, worse over last few days. Pt seen at [facility] and sent here.

## 2023-02-02 NOTE — Discharge Instructions (Signed)
Follow-up with your family doctor in the next couple weeks for recheck 

## 2023-02-02 NOTE — ED Provider Notes (Signed)
[place] EMERGENCY DEPARTMENT AT [facility] Provider Note   CSN: [id number] Arrival date & time: 02/02/23  1101     History {Add pertinent medical, surgical, social history, OB history to HPI:1} Chief Complaint  Patient presents with   Shortness of Breath    Kendra White is a 89 y.o. female.  Patient with a history of anxiety, tremors, diabetes.  She comes in complaining of her arm shaking   Weakness      Home Medications Prior to Admission medications   Medication Sig Start Date End Date Taking? Authorizing Provider  hydrOXYzine (ATARAX) 25 MG tablet Take 1 every 12 hours as needed for shaking 02/02/23  Yes Bethann Berkshire, MD  acetaminophen (TYLENOL) 325 MG tablet Take 325-650 mg by mouth daily as needed for mild pain or moderate pain. For pain     [provider]  ALPRAZolam (XANAX) 0.25 MG tablet Take 0.125-0.25 mg by mouth 3 (three) times daily as needed for anxiety.     [provider]  Biotin 5000 MCG CAPS Take by mouth daily.    [provider]  buPROPion (WELLBUTRIN XL) 300 MG 24 hr tablet Take 300 mg by mouth every morning.    [provider]  citalopram (CELEXA) 40 MG tablet Take 40 mg by mouth daily.    [provider]  donepezil (ARICEPT) 5 MG tablet Take 5 mg at bedtime by mouth. 05/19/17   [provider]  fenofibrate 160 MG tablet Take 160 mg by mouth daily.    [provider]  fluticasone (FLONASE) 50 MCG/ACT nasal spray Place 2 sprays into both nostrils daily.    [provider]  Garlic 1000 MG CAPS Take by mouth.    [provider]  levothyroxine (SYNTHROID) 50 MCG tablet Take 50 mcg by mouth daily.    [provider]  losartan-hydrochlorothiazide (HYZAAR) 50-12.5 MG tablet Take 1 tablet by mouth daily.    [provider]  memantine (NAMENDA) 5 MG tablet Take 5 mg by mouth 2 (two) times daily.    [provider]  metFORMIN (GLUCOPHAGE) 500  MG tablet Take 500 mg by mouth at bedtime.     [provider]  Multiple Vitamins-Minerals (MULTIVITAMIN WITH MINERALS) tablet Take 1 tablet by mouth daily.      [provider]  Omega-3 Fatty Acids (FISH OIL) 1000 MG CAPS Take by mouth.    [provider]  pantoprazole (PROTONIX) 40 MG tablet Take 1 tablet (40 mg total) by mouth 2 (two) times daily before a meal. 03/31/17 01/01/23  Noralee Stain, DO  primidone (MYSOLINE) 50 MG tablet Take 50 mg by mouth 3 (three) times daily. 05/08/17   [provider]  rosuvastatin (CRESTOR) 40 MG tablet Take 40 mg by mouth daily.    [provider]  tretinoin (RETIN-A) 0.05 % cream Apply topically. 06/01/22 06/01/23  [provider]      Allergies    Atorvastatin, Diphenhydramine hcl, and Rosuvastatin    Review of Systems   Review of Systems  Neurological:  Positive for weakness.    Physical Exam Updated Vital Signs BP (!) 145/67   Pulse 75   Temp 97.8 F (36.6 C) (Oral)   Resp 14   Ht 5\' 2"  (1.575 m)   Wt 74.8 kg   SpO2 95%   BMI 30.18 kg/m  Physical Exam  ED Results / Procedures / Treatments   Labs (all labs ordered are listed, but only abnormal results  are displayed) Labs Reviewed  CBC WITH DIFFERENTIAL/PLATELET - Abnormal; Notable for the following components:      Result Value   RBC 3.71 (*)    Hemoglobin 11.1 (*)    HCT 33.6 (*)    All other components within normal limits  COMPREHENSIVE METABOLIC PANEL - Abnormal; Notable for the following components:   Sodium 134 (*)    BUN 36 (*)    Creatinine, Ser 1.52 (*)    Alkaline Phosphatase 20 (*)    GFR, Estimated 32 (*)    All other components within normal limits  URINALYSIS, ROUTINE W REFLEX MICROSCOPIC - Abnormal; Notable for the following components:   APPearance HAZY (*)    All other components within normal limits    EKG EKG Interpretation Date/Time:  Thursday February 02 2023 11:27:52 EDT Ventricular Rate:  85 PR  Interval:  301 QRS Duration:  99 QT Interval:  383 QTC Calculation: 456 R Axis:   57  Text Interpretation: Sinus rhythm Prolonged PR interval Anterior infarct, old Confirmed by Bethann Berkshire [phone]) on 02/02/2023 3:04:18 PM  Radiology No results found.  Procedures Procedures  {Document cardiac monitor, telemetry assessment procedure when appropriate:1}  Medications Ordered in ED Medications - No data to display  ED Course/ Medical Decision Making/ A&P   {   Click here for ABCD2, HEART and other calculatorsREFRESH Note before signing :1}                          Medical Decision Making Amount and/or Complexity of Data Reviewed Labs: ordered.  Risk Prescription drug management.  Patient with anxiety and tremors.  She is given Vistaril follow-up with PCP  {Document critical care time when appropriate:1} {Document review of labs and clinical decision tools ie heart score, Chads2Vasc2 etc:1}  {Document your independent review of radiology images, and any outside records:1} {Document your discussion with family members, caretakers, and with consultants:1} {Document social determinants of health affecting pt's care:1} {Document your decision making why or why not admission, treatments were needed:1} Final Clinical Impression(s) / ED Diagnoses Final diagnoses:  Tremors of nervous system    Rx / DC Orders ED Discharge Orders          Ordered    hydrOXYzine (ATARAX) 25 MG tablet        02/02/23 1505

## 2023-03-13 ENCOUNTER — Ambulatory Visit: Payer: Medicare Other | Admitting: Internal Medicine

## 2023-06-03 ENCOUNTER — Emergency Department (HOSPITAL_COMMUNITY)
Admission: EM | Admit: 2023-06-03 | Discharge: 2023-06-03 | Disposition: A | Discharge status: Home / Self Care | Payer: Medicare Other | Attending: Emergency Medicine | Admitting: Emergency Medicine

## 2023-06-03 ENCOUNTER — Encounter (HOSPITAL_COMMUNITY): Payer: Self-pay

## 2023-06-03 ENCOUNTER — Emergency Department (HOSPITAL_COMMUNITY): Payer: Medicare Other

## 2023-06-03 ENCOUNTER — Other Ambulatory Visit: Payer: Self-pay

## 2023-06-03 DIAGNOSIS — Z7984 Long term (current) use of oral hypoglycemic drugs: Secondary | ICD-10-CM | POA: Insufficient documentation

## 2023-06-03 DIAGNOSIS — N189 Chronic kidney disease, unspecified: Secondary | ICD-10-CM | POA: Insufficient documentation

## 2023-06-03 DIAGNOSIS — R1084 Generalized abdominal pain: Secondary | ICD-10-CM | POA: Insufficient documentation

## 2023-06-03 DIAGNOSIS — E119 Type 2 diabetes mellitus without complications: Secondary | ICD-10-CM | POA: Insufficient documentation

## 2023-06-03 DIAGNOSIS — R110 Nausea: Secondary | ICD-10-CM | POA: Insufficient documentation

## 2023-06-03 DIAGNOSIS — K5900 Constipation, unspecified: Secondary | ICD-10-CM | POA: Diagnosis present

## 2023-06-03 LAB — URINALYSIS, ROUTINE W REFLEX MICROSCOPIC
Bilirubin Urine: NEGATIVE
Glucose, UA: NEGATIVE mg/dL
Hgb urine dipstick: NEGATIVE
Ketones, ur: NEGATIVE mg/dL
Nitrite: NEGATIVE
Protein, ur: NEGATIVE mg/dL
Specific Gravity, Urine: 1.011 (ref 1.005–1.030)
pH: 6 (ref 5.0–8.0)

## 2023-06-03 LAB — CBC WITH DIFFERENTIAL/PLATELET
Abs Immature Granulocytes: 0.02 10*3/uL (ref 0.00–0.07)
Basophils Absolute: 0.1 10*3/uL (ref 0.0–0.1)
Basophils Relative: 1 %
Eosinophils Absolute: 0.1 10*3/uL (ref 0.0–0.5)
Eosinophils Relative: 2 %
HCT: 33 % — ABNORMAL LOW (ref 36.0–46.0)
Hemoglobin: 11.2 g/dL — ABNORMAL LOW (ref 12.0–15.0)
Immature Granulocytes: 0 %
Lymphocytes Relative: 21 %
Lymphs Abs: 1.2 10*3/uL (ref 0.7–4.0)
MCH: 29.9 pg (ref 26.0–34.0)
MCHC: 33.9 g/dL (ref 30.0–36.0)
MCV: 88.2 fL (ref 80.0–100.0)
Monocytes Absolute: 0.7 10*3/uL (ref 0.1–1.0)
Monocytes Relative: 13 %
Neutro Abs: 3.4 10*3/uL (ref 1.7–7.7)
Neutrophils Relative %: 63 %
Platelets: 390 10*3/uL (ref 150–400)
RBC: 3.74 MIL/uL — ABNORMAL LOW (ref 3.87–5.11)
RDW: 15.9 % — ABNORMAL HIGH (ref 11.5–15.5)
WBC: 5.5 10*3/uL (ref 4.0–10.5)
nRBC: 0 % (ref 0.0–0.2)

## 2023-06-03 LAB — COMPREHENSIVE METABOLIC PANEL
ALT: 15 U/L (ref 0–44)
AST: 24 U/L (ref 15–41)
Albumin: 3.3 g/dL — ABNORMAL LOW (ref 3.5–5.0)
Alkaline Phosphatase: 24 U/L — ABNORMAL LOW (ref 38–126)
Anion gap: 10 (ref 5–15)
BUN: 30 mg/dL — ABNORMAL HIGH (ref 8–23)
CO2: 21 mmol/L — ABNORMAL LOW (ref 22–32)
Calcium: 9.2 mg/dL (ref 8.9–10.3)
Chloride: 102 mmol/L (ref 98–111)
Creatinine, Ser: 1.46 mg/dL — ABNORMAL HIGH (ref 0.44–1.00)
GFR, Estimated: 34 mL/min — ABNORMAL LOW (ref >60)
Glucose, Bld: 143 mg/dL — ABNORMAL HIGH (ref 70–99)
Potassium: 3.9 mmol/L (ref 3.5–5.1)
Sodium: 133 mmol/L — ABNORMAL LOW (ref 135–145)
Total Bilirubin: 0.5 mg/dL (ref <1.2)
Total Protein: 7.2 g/dL (ref 6.5–8.1)

## 2023-06-03 LAB — LIPASE, BLOOD: Lipase: 43 U/L (ref 11–51)

## 2023-06-03 LAB — CK: Total CK: 86 U/L (ref 38–234)

## 2023-06-03 MED ORDER — POLYETHYLENE GLYCOL 3350 17 GM/SCOOP PO POWD: 17.0000 g | Freq: Two times a day (BID) | ORAL | 0 refills | Status: AC

## 2023-06-03 NOTE — ED Notes (Signed)
Informed pt of need for urine sample, per pt she is incontinent.

## 2023-06-03 NOTE — Discharge Instructions (Addendum)
Pleasure taking care of you today.  You were seen for constipation.  Blood work was reassuring and your CAT scan showed stool primarily on the right side, there is no fecal impaction.  You also have a lot of gallstones which are found incidentally and hardening of the arteries.  Follow close with your primary care doctor for the incidental findings.  You to follow-up with GI as well if you continue having issues with constipation.  We are going to increase your MiraLAX to twice a day, and you can take the over-the-counter senna as directed on packaging for the 2-3 of days as well.  Come back to the ER if you have vomiting, abdominal pain or other worrisome changes.

## 2023-06-03 NOTE — ED Provider Notes (Signed)
[place] EMERGENCY DEPARTMENT AT [facility] Provider Note   CSN: [id number] Arrival date & time: 06/03/23  1220     History  Chief Complaint  Patient presents with   Constipation    Kendra White is a 89 y.o. female.  Has PMH of diabetes, CKD, A-fib.  Presents to ER for constipation.  Last follow-up was 10 days ago.  She states the last time she had bowel movement was on a day that she had gotten dizzy while standing up at her sink, she lowered herself to the ground and did not injure herself but states she was unable to get herself back up so laid on the floor all night, was able to get up the next day and has not had mono since then also having some generalized aching and diffuse mid abdominal pain since then, she has had nausea but no vomiting, she has tried MiraLAX and Senokot and a fleets enema without relief.  She also takes fiber daily routinely.   Constipation      Home Medications Prior to Admission medications   Medication Sig Start Date End Date Taking? Authorizing Provider  acetaminophen (TYLENOL) 325 MG tablet Take 325-650 mg by mouth daily as needed for mild pain or moderate pain. For pain     [provider]  ALPRAZolam (XANAX) 0.25 MG tablet Take 0.125-0.25 mg by mouth 3 (three) times daily as needed for anxiety.     [provider]  Biotin 5000 MCG CAPS Take by mouth daily.    [provider]  buPROPion (WELLBUTRIN XL) 300 MG 24 hr tablet Take 300 mg by mouth every morning.    [provider]  citalopram (CELEXA) 40 MG tablet Take 40 mg by mouth daily.    [provider]  donepezil (ARICEPT) 5 MG tablet Take 5 mg at bedtime by mouth. 05/19/17   [provider]  fenofibrate 160 MG tablet Take 160 mg by mouth daily.    [provider]  fluticasone (FLONASE) 50 MCG/ACT nasal spray Place 2 sprays into both nostrils daily.    [provider]  Garlic 1000 MG CAPS Take by mouth.     [provider]  hydrOXYzine (ATARAX) 25 MG tablet Take 1 every 12 hours as needed for shaking 02/02/23   Bethann Berkshire, MD  levothyroxine (SYNTHROID) 50 MCG tablet Take 50 mcg by mouth daily.    [provider]  losartan-hydrochlorothiazide (HYZAAR) 50-12.5 MG tablet Take 1 tablet by mouth daily.    [provider]  memantine (NAMENDA) 5 MG tablet Take 5 mg by mouth 2 (two) times daily.    [provider]  metFORMIN (GLUCOPHAGE) 500 MG tablet Take 500 mg by mouth at bedtime.     [provider]  Multiple Vitamins-Minerals (MULTIVITAMIN WITH MINERALS) tablet Take 1 tablet by mouth daily.      [provider]  Omega-3 Fatty Acids (FISH OIL) 1000 MG CAPS Take by mouth.    [provider]  pantoprazole (PROTONIX) 40 MG tablet Take 1 tablet (40 mg total) by mouth 2 (two) times daily before a meal. 03/31/17 01/01/23  Noralee Stain, DO  primidone (MYSOLINE) 50 MG tablet Take 50 mg by mouth 3 (three) times daily. 05/08/17   [provider]  rosuvastatin (CRESTOR) 40 MG tablet Take 40 mg by mouth daily.    [provider]      Allergies    Atorvastatin, Diphenhydramine hcl, and Rosuvastatin    Review  of Systems   Review of Systems  Gastrointestinal:  Positive for constipation.    Physical Exam Updated Vital Signs BP 139/72 (BP Location: Right Arm)   Pulse 91   Temp 97.9 F (36.6 C)   Resp 19   Ht 5\' 2"  (1.575 m)   Wt 75.8 kg   SpO2 96%   BMI 30.54 kg/m  Physical Exam Vitals and nursing note reviewed.  Constitutional:      General: She is not in acute distress.    Appearance: She is well-developed.  HENT:     Head: Normocephalic and atraumatic.     Mouth/Throat:     Mouth: Mucous membranes are moist.  Eyes:     Conjunctiva/sclera: Conjunctivae normal.  Cardiovascular:     Rate and Rhythm: Normal rate and regular rhythm.     Heart sounds: No murmur heard. Pulmonary:     Effort: Pulmonary effort is  normal. No respiratory distress.     Breath sounds: Normal breath sounds.  Abdominal:     Palpations: Abdomen is soft.     Tenderness: There is no abdominal tenderness.     Comments: Mild distention  Musculoskeletal:        General: No swelling.     Cervical back: Neck supple.  Skin:    General: Skin is warm and dry.     Capillary Refill: Capillary refill takes less than 2 seconds.  Neurological:     General: No focal deficit present.     Mental Status: She is alert and oriented to person, place, and time.  Psychiatric:        Mood and Affect: Mood normal.     ED Results / Procedures / Treatments   Labs (all labs ordered are listed, but only abnormal results are displayed) Labs Reviewed  CBC WITH DIFFERENTIAL/PLATELET  COMPREHENSIVE METABOLIC PANEL  LIPASE, BLOOD  URINALYSIS, ROUTINE W REFLEX MICROSCOPIC  CK    EKG None  Radiology No results found.  Procedures Procedures    Medications Ordered in ED Medications - No data to display  ED Course/ Medical Decision Making/ A&P                                 Medical Decision Making This patient presents to the ED for concern of constipation, this involves an extensive number of treatment options, and is a complaint that carries with it a high risk of complications and morbidity.  The differential diagnosis includes bowel obstruction, constipation, fecal impaction, UTI, other    Co morbidities that complicate the patient evaluation  CKD   Additional history obtained:  Additional history obtained from EMR External records from outside source obtained and reviewed including notes   Lab Tests:  I Ordered, and personally interpreted labs.  The pertinent results include: Urinalysis has provided what 50 white blood cells and rare bacteria but is contaminated with 16 squamous epithelial cells, sent for culture due to lack of urinary symptoms, normal lipase, normal CK, CMP shows baseline renal function, normal  LFTs, CBC shows no leukocytosis, mild anemia around patient's baseline of 11.2   Imaging Studies ordered:  I ordered imaging studies including CT abdomen pelvis I independently visualized and interpreted imaging which showed also without signs of cholecystitis and stool in the ascending and transverse colon, no fecal impaction I agree with the radiologist interpretation     Problem List / ED Course / Critical interventions / Medication management  Patient is here for constipation x 10 days.  She has been taking MiraLAX once a day without relief.  She takes MiraLAX once a day at baseline, she took senna and tried an enema yesterday without relief.  She has some mild diffuse mid abdominal pain with this but has a soft abdomen with minimal tenderness with no rebound guarding rigidity.  CT ordered due to her pain and especially given her age as well.  No acute findings on this but there is also no fecal impaction so I do not feel an enema would be helpful.  I discussed with patient to have her increase her MiraLAX to twice a day and she can take the over-the-counter senna for the next 2 to 3 days as well until she has a bowel movement.  Advised to follow-up with her primary care doctor, given strict return precautions.  I have reviewed the patients home medicines and have made adjustments as needed       Amount and/or Complexity of Data Reviewed Labs: ordered. Radiology: ordered.           Final Clinical Impression(s) / ED Diagnoses Final diagnoses:  None    Rx / DC Orders ED Discharge Orders     None         Josem Kaufmann 06/03/23 2018    Eber Hong, MD 06/04/23 1455

## 2023-06-03 NOTE — ED Triage Notes (Addendum)
Pt reports no BM in over a week and she has tried mylanta, "something made from a plant " and an enema with no results.  Pt reports she is able to eat and drink without problems.

## 2023-06-03 NOTE — ED Notes (Signed)
Patient transported to CT 

## 2023-06-06 LAB — URINE CULTURE

## 2023-06-06 NOTE — Progress Notes (Deleted)
Referring Provider: Ma Rings, PA-C [facility]) Primary Care Physician:  Rebecka Apley, NP Primary Gastroenterologist:  Previously Dr. Caren Macadam. Establishing with Dr. Jena Gauss.   No chief complaint on file.   HPI:   Kendra White is a 89 y.o. female with history of PUD, H. pylori with documented eradication on surveillance EGD November 2018, CAD, diabetes, HTN, TIA, vascular dementia, presenting today at the request of [facility], ER for constipation.  Patient was seen in the ER 06/03/2023 for constipation x 10 days, taking MiraLAX daily without relief.  Tried senna and enema without relief.  Associated mid abdominal pain.CT A/P without contrast with moderate amount of formed stool predominantly in the ascending and transverse colon.  She was advised to increase MiraLAX to twice daily and take over-the-counter senna for the next 2 to 3 days.  Today:    Past Medical History:  Diagnosis Date   Acute renal insufficiency 09/16/2016   Anxiety    Blood transfusion    Coronary artery disease due to lipid rich plaque 10/27/2016   Dependent edema    Diabetes mellitus    Essential hypertension 08/12/2016   Headache(784.0)    Hx-TIA (transient ischemic attack)    Hypercholesteremia    Moderate episode of recurrent major depressive disorder (HCC) 12/07/2016   Sciatica, left side 08/12/2016   Swelling of ankle    and abdomen   Ulcer    Vascular dementia without behavioral disturbance (HCC) 11/11/2016    Past Surgical History:  Procedure Laterality Date   ABDOMINAL HYSTERECTOMY     COLONOSCOPY     DILATION AND CURETTAGE OF UTERUS     ESOPHAGOGASTRODUODENOSCOPY (EGD) WITH PROPOFOL N/A 03/30/2017   Procedure: ESOPHAGOGASTRODUODENOSCOPY (EGD) WITH PROPOFOL;  Surgeon: Sherrilyn Rist, MD;  Location: [facility];  Service: Gastroenterology;  Laterality: N/A;   LAPAROSCOPIC APPENDECTOMY  02/07/2011   Procedure: APPENDECTOMY LAPAROSCOPIC;  Surgeon: Dalia Heading;  Location: [facility]  ORS;  Service: General;  Laterality: N/A;   POLYPECTOMY      Current Outpatient Medications  Medication Sig Dispense Refill   acetaminophen (TYLENOL) 325 MG tablet Take 325-650 mg by mouth daily as needed for mild pain or moderate pain. For pain      ALPRAZolam (XANAX) 0.25 MG tablet Take 0.125-0.25 mg by mouth 3 (three) times daily as needed for anxiety.      Biotin 5000 MCG CAPS Take by mouth daily.     buPROPion (WELLBUTRIN XL) 300 MG 24 hr tablet Take 300 mg by mouth every morning.     citalopram (CELEXA) 40 MG tablet Take 40 mg by mouth daily.     donepezil (ARICEPT) 5 MG tablet Take 5 mg at bedtime by mouth.  2   fenofibrate 160 MG tablet Take 160 mg by mouth daily.     fluticasone (FLONASE) 50 MCG/ACT nasal spray Place 2 sprays into both nostrils daily.     Garlic 1000 MG CAPS Take by mouth.     hydrOXYzine (ATARAX) 25 MG tablet Take 1 every 12 hours as needed for shaking 20 tablet 0   levothyroxine (SYNTHROID) 50 MCG tablet Take 50 mcg by mouth daily.     losartan-hydrochlorothiazide (HYZAAR) 50-12.5 MG tablet Take 1 tablet by mouth daily.     memantine (NAMENDA) 5 MG tablet Take 5 mg by mouth 2 (two) times daily.     metFORMIN (GLUCOPHAGE) 500 MG tablet Take 500 mg by mouth at bedtime.      Multiple Vitamins-Minerals (MULTIVITAMIN  WITH MINERALS) tablet Take 1 tablet by mouth daily.       Omega-3 Fatty Acids (FISH OIL) 1000 MG CAPS Take by mouth.     pantoprazole (PROTONIX) 40 MG tablet Take 1 tablet (40 mg total) by mouth 2 (two) times daily before a meal. 60 tablet 0   polyethylene glycol powder (MIRALAX) 17 GM/SCOOP powder Take 17 g by mouth in the morning and at bedtime. 255 g 0   primidone (MYSOLINE) 50 MG tablet Take 50 mg by mouth 3 (three) times daily.     rosuvastatin (CRESTOR) 40 MG tablet Take 40 mg by mouth daily.     No current facility-administered medications for this visit.    Allergies as of 06/07/2023 - Review Complete 06/03/2023  Allergen Reaction Noted    Atorvastatin  11/11/2016   Diphenhydramine hcl Rash 02/07/2011   Rosuvastatin  11/11/2016    Family History  Problem Relation Age of Onset   Colon cancer Father    Colon polyps Maternal Aunt    Pancreatic cancer Brother     Social History   Socioeconomic History   Marital status: Married    Spouse name: Not on file   Number of children: Not on file   Years of education: Not on file   Highest education level: Not on file  Occupational History   Not on file  Tobacco Use   Smoking status: Never   Smokeless tobacco: Never  Vaping Use   Vaping status: Never Used  Substance and Sexual Activity   Alcohol use: No   Drug use: No   Sexual activity: Yes    Birth control/protection: None  Other Topics Concern   Not on file  Social History Narrative   Not on file   Social Determinants of Health   Financial Resource Strain: Low Risk  (03/12/2023)   Received from [facility]   Overall Financial Resource Strain (CARDIA)    Difficulty of Paying Living Expenses: Not hard at all  Food Insecurity: No Food Insecurity (03/12/2023)   Received from [facility]   Hunger Vital Sign    Worried About Running Out of Food in the Last Year: Never true    Ran Out of Food in the Last Year: Never true  Transportation Needs: No Transportation Needs (03/12/2023)   Received from [facility] - Transportation    Lack of Transportation (Medical): No    Lack of Transportation (Non-Medical): No  Physical Activity: Unknown (03/12/2023)   Received from [facility]   Exercise Vital Sign    Days of Exercise per Week: 0 days    Minutes of Exercise per Session: Not on file  Stress: Stress Concern Present (03/12/2023)   Received from [facility] - Occupational Stress Questionnaire    Feeling of Stress : Rather much  Social Connections: Somewhat Isolated (03/12/2023)   Received from [facility]   Social Network    How would you rate your  social network (family, work, friends)?: Restricted participation with some degree of social isolation  Intimate Partner Violence: Not At Risk (03/12/2023)   Received from [facility]   HITS    Over the last 12 months how often did your partner physically hurt you?: Never    Over the last 12 months how often did your partner insult you or talk down to you?: Never    Over the last 12 months how often did your partner threaten you with physical harm?:  Never    Over the last 12 months how often did your partner scream or curse at you?: Never    Review of Systems: Gen: Denies any fever, chills, fatigue, weight loss, lack of appetite.  CV: Denies chest pain, heart palpitations, peripheral edema, syncope.  Resp: Denies shortness of breath at rest or with exertion. Denies wheezing or cough.  GI: Denies dysphagia or odynophagia. Denies jaundice, hematemesis, fecal incontinence. GU : Denies urinary burning, urinary frequency, urinary hesitancy MS: Denies joint pain, muscle weakness, cramps, or limitation of movement.  Derm: Denies rash, itching, dry skin Psych: Denies depression, anxiety, memory loss, and confusion Heme: Denies bruising, bleeding, and enlarged lymph nodes.  Physical Exam: There were no vitals taken for this visit. General:   Alert and oriented. Pleasant and cooperative. Well-nourished and well-developed.  Head:  Normocephalic and atraumatic. Eyes:  Without icterus, sclera clear and conjunctiva pink.  Ears:  Normal auditory acuity. Lungs:  Clear to auscultation bilaterally. No wheezes, rales, or rhonchi. No distress.  Heart:  S1, S2 present without murmurs appreciated.  Abdomen:  +BS, soft, non-tender and non-distended. No HSM noted. No guarding or rebound. No masses appreciated.  Rectal:  Deferred  Msk:  Symmetrical without gross deformities. Normal posture. Extremities:  Without edema. Neurologic:  Alert and  oriented x4;  grossly normal neurologically. Skin:  Intact  without significant lesions or rashes. Psych:  Alert and cooperative. Normal mood and affect.    Assessment:     Plan:  ***   Ermalinda Memos, PA-C [facility] 06/07/2023

## 2023-06-07 ENCOUNTER — Encounter (HOSPITAL_COMMUNITY): Payer: Self-pay

## 2023-06-07 ENCOUNTER — Emergency Department (HOSPITAL_COMMUNITY): Payer: Medicare Other

## 2023-06-07 ENCOUNTER — Other Ambulatory Visit: Payer: Self-pay

## 2023-06-07 ENCOUNTER — Ambulatory Visit: Payer: Medicare Other | Admitting: Gastroenterology

## 2023-06-07 ENCOUNTER — Emergency Department (HOSPITAL_COMMUNITY)
Admission: EM | Admit: 2023-06-07 | Discharge: 2023-06-07 | Disposition: A | Discharge status: Home / Self Care | Payer: Medicare Other | Attending: Emergency Medicine | Admitting: Emergency Medicine

## 2023-06-07 DIAGNOSIS — R1011 Right upper quadrant pain: Secondary | ICD-10-CM | POA: Insufficient documentation

## 2023-06-07 DIAGNOSIS — Z79899 Other long term (current) drug therapy: Secondary | ICD-10-CM | POA: Insufficient documentation

## 2023-06-07 DIAGNOSIS — F0390 Unspecified dementia without behavioral disturbance: Secondary | ICD-10-CM | POA: Diagnosis not present

## 2023-06-07 DIAGNOSIS — I10 Essential (primary) hypertension: Secondary | ICD-10-CM | POA: Diagnosis not present

## 2023-06-07 DIAGNOSIS — M549 Dorsalgia, unspecified: Secondary | ICD-10-CM | POA: Diagnosis present

## 2023-06-07 DIAGNOSIS — E119 Type 2 diabetes mellitus without complications: Secondary | ICD-10-CM | POA: Insufficient documentation

## 2023-06-07 DIAGNOSIS — Z7984 Long term (current) use of oral hypoglycemic drugs: Secondary | ICD-10-CM | POA: Diagnosis not present

## 2023-06-07 DIAGNOSIS — M5489 Other dorsalgia: Secondary | ICD-10-CM

## 2023-06-07 LAB — CBC WITH DIFFERENTIAL/PLATELET
Abs Immature Granulocytes: 0.02 10*3/uL (ref 0.00–0.07)
Basophils Absolute: 0.1 10*3/uL (ref 0.0–0.1)
Basophils Relative: 1 %
Eosinophils Absolute: 0.1 10*3/uL (ref 0.0–0.5)
Eosinophils Relative: 1 %
HCT: 33.4 % — ABNORMAL LOW (ref 36.0–46.0)
Hemoglobin: 11.3 g/dL — ABNORMAL LOW (ref 12.0–15.0)
Immature Granulocytes: 0 %
Lymphocytes Relative: 22 %
Lymphs Abs: 1.5 10*3/uL (ref 0.7–4.0)
MCH: 29.5 pg (ref 26.0–34.0)
MCHC: 33.8 g/dL (ref 30.0–36.0)
MCV: 87.2 fL (ref 80.0–100.0)
Monocytes Absolute: 0.8 10*3/uL (ref 0.1–1.0)
Monocytes Relative: 13 %
Neutro Abs: 4.1 10*3/uL (ref 1.7–7.7)
Neutrophils Relative %: 63 %
Platelets: 450 10*3/uL — ABNORMAL HIGH (ref 150–400)
RBC: 3.83 MIL/uL — ABNORMAL LOW (ref 3.87–5.11)
RDW: 15.8 % — ABNORMAL HIGH (ref 11.5–15.5)
WBC: 6.6 10*3/uL (ref 4.0–10.5)
nRBC: 0 % (ref 0.0–0.2)

## 2023-06-07 LAB — COMPREHENSIVE METABOLIC PANEL
ALT: 18 U/L (ref 0–44)
AST: 24 U/L (ref 15–41)
Albumin: 3.1 g/dL — ABNORMAL LOW (ref 3.5–5.0)
Alkaline Phosphatase: 26 U/L — ABNORMAL LOW (ref 38–126)
Anion gap: 10 (ref 5–15)
BUN: 29 mg/dL — ABNORMAL HIGH (ref 8–23)
CO2: 19 mmol/L — ABNORMAL LOW (ref 22–32)
Calcium: 9.3 mg/dL (ref 8.9–10.3)
Chloride: 103 mmol/L (ref 98–111)
Creatinine, Ser: 1.26 mg/dL — ABNORMAL HIGH (ref 0.44–1.00)
GFR, Estimated: 41 mL/min — ABNORMAL LOW (ref >60)
Glucose, Bld: 122 mg/dL — ABNORMAL HIGH (ref 70–99)
Potassium: 4.1 mmol/L (ref 3.5–5.1)
Sodium: 132 mmol/L — ABNORMAL LOW (ref 135–145)
Total Bilirubin: 0.8 mg/dL (ref <1.2)
Total Protein: 6.8 g/dL (ref 6.5–8.1)

## 2023-06-07 LAB — URINALYSIS, ROUTINE W REFLEX MICROSCOPIC
Bilirubin Urine: NEGATIVE
Glucose, UA: NEGATIVE mg/dL
Hgb urine dipstick: NEGATIVE
Ketones, ur: NEGATIVE mg/dL
Leukocytes,Ua: NEGATIVE
Nitrite: NEGATIVE
Protein, ur: NEGATIVE mg/dL
Specific Gravity, Urine: 1.01 (ref 1.005–1.030)
pH: 6 (ref 5.0–8.0)

## 2023-06-07 LAB — LIPASE, BLOOD: Lipase: 38 U/L (ref 11–51)

## 2023-06-07 LAB — CK: Total CK: 61 U/L (ref 38–234)

## 2023-06-07 MED ORDER — HYDROCODONE-ACETAMINOPHEN 5-325 MG PO TABS: 1.0000 | ORAL_TABLET | ORAL | 0 refills | Status: DC | PRN

## 2023-06-07 MED ORDER — HYDROCODONE-ACETAMINOPHEN 5-325 MG PO TABS
1.0000 | ORAL_TABLET | Freq: Once | ORAL | Status: AC
  Administered 2023-06-07: 1 via ORAL
  Filled 2023-06-07: qty 1

## 2023-06-07 MED ORDER — HYDROCODONE-ACETAMINOPHEN 5-325 MG PO TABS: ORAL_TABLET | ORAL | 0 refills | Status: AC

## 2023-06-07 MED ORDER — MORPHINE SULFATE (PF) 4 MG/ML IV SOLN
2.0000 mg | Freq: Once | INTRAVENOUS | Status: AC
  Administered 2023-06-07: 2 mg via INTRAVENOUS
  Filled 2023-06-07: qty 1

## 2023-06-07 MED ORDER — ONDANSETRON HCL 4 MG/2ML IJ SOLN
4.0000 mg | Freq: Once | INTRAMUSCULAR | Status: AC
  Administered 2023-06-07: 4 mg via INTRAVENOUS
  Filled 2023-06-07: qty 2

## 2023-06-07 NOTE — ED Notes (Signed)
Ambulated pt in hallway using walker. Pt unable to ambulate very far at approximately 6-7 ft. Pt needs someone assisting as she ambulates and she's complaining of severe pain.

## 2023-06-07 NOTE — Discharge Instructions (Addendum)
You were seen for your back pain in the emergency department.   At home, please use over-the-counter Tylenol and lidocaine patches.  You may also use the norco we have prescribed you as needed for pain.  Do not take the norco before driving or operating heavy machinery.  Follow-up with your primary doctor in 2-3 days regarding your visit.  Follow-up with the spine clinic as soon as possible regarding your symptoms.  Return immediately to the emergency department if you experience any of the following: Numbness or weakness of your legs, bowel or bladder incontinence, numbness while wiping after pooping or urinating, or any other concerning symptoms.    Thank you for visiting our Emergency Department. It was a pleasure taking care of you today.  Follow-up with either your family doctor or your gastroenterologist in the next week.  Return if any problems

## 2023-06-07 NOTE — ED Triage Notes (Signed)
Pt complains of back pain that started two weeks ago per pt. Pt was seen in [facility] on 06/03/23 for constipation and gallstones found during exam. Pt not complaining of constipation and having regular bowel movements since ED visit per EMS. Family states pt is having so much pain she is unable to get up out of bed. CBG 121 in route. Was given toradol by EMS and then fentanyl 100 mcg IV. EMS reports medication was ineffective.

## 2023-06-07 NOTE — ED Provider Notes (Signed)
Patient with back pain ever since she fell and hurt her back.  Also with some right upper quadrant pain.  She had a CT scan recently that showed gallstones.  Ultrasound of her abdomen showed gallstones but no cholecystitis.  Patient was improved with treatment in the emergency department.  She is sent home with some pain medicine and will follow-up with GI and her family doctor   Bethann Berkshire, MD 06/07/23 [phone]

## 2023-06-07 NOTE — ED Notes (Signed)
Reviewed D/C information with the patient, pt verbalized understanding. No additional concerns at this time. Caregiver to take the patients belongings home and meet EMS to assist with getting into her home.

## 2023-06-07 NOTE — ED Notes (Signed)
Pt in hallway eating peanut butter and jelly sandwich. Home care aide at bedside

## 2023-06-07 NOTE — ED Provider Notes (Signed)
[place] EMERGENCY DEPARTMENT AT [facility] Provider Note   CSN: [id number] Arrival date & time: 06/07/23  1047     History {Add pertinent medical, surgical, social history, OB history to HPI:1} Chief Complaint  Patient presents with   Back Pain    X2 weeks    Kendra White is a 89 y.o. female.  89 year old female with history of vascular dementia, sciatica, diabetes, hypertension, and hyperlipidemia who presents to the emergency department with back pain.  Patient ports that 2 weeks ago she fell and was on the ground all night.  After that started develop some back pain.  Came into the emergency department had a Noncon CT scan of her abdomen and pelvis which did not show acute abnormality or fracture and she was discharged home.  It did show some gallstones.  Says that since then she has been having intermittent back pain that radiates to her right upper quadrant.  Says it is constant.  Describes it as a sharp stabbing sensation.  Worsened with movement.  When not moving is approximately 2/10 in severity at this time.  Has been trying lidocaine patches and Tylenol at home without improvement.  EMS gave her Toradol and fentanyl en route.       Home Medications Prior to Admission medications   Medication Sig Start Date End Date Taking? Authorizing Provider  acetaminophen (TYLENOL) 325 MG tablet Take 325-650 mg by mouth daily as needed for mild pain or moderate pain. For pain     [provider]  ALPRAZolam (XANAX) 0.25 MG tablet Take 0.125-0.25 mg by mouth 3 (three) times daily as needed for anxiety.     [provider]  Biotin 5000 MCG CAPS Take by mouth daily.    [provider]  buPROPion (WELLBUTRIN XL) 300 MG 24 hr tablet Take 300 mg by mouth every morning.    [provider]  citalopram (CELEXA) 40 MG tablet Take 40 mg by mouth daily.    [provider]  donepezil (ARICEPT) 5 MG tablet Take 5 mg at bedtime by mouth.  05/19/17   [provider]  fenofibrate 160 MG tablet Take 160 mg by mouth daily.    [provider]  fluticasone (FLONASE) 50 MCG/ACT nasal spray Place 2 sprays into both nostrils daily.    [provider]  Garlic 1000 MG CAPS Take by mouth.    [provider]  hydrOXYzine (ATARAX) 25 MG tablet Take 1 every 12 hours as needed for shaking 02/02/23   Bethann Berkshire, MD  levothyroxine (SYNTHROID) 50 MCG tablet Take 50 mcg by mouth daily.    [provider]  losartan-hydrochlorothiazide (HYZAAR) 50-12.5 MG tablet Take 1 tablet by mouth daily.    [provider]  memantine (NAMENDA) 5 MG tablet Take 5 mg by mouth 2 (two) times daily.    [provider]  metFORMIN (GLUCOPHAGE) 500 MG tablet Take 500 mg by mouth at bedtime.     [provider]  Multiple Vitamins-Minerals (MULTIVITAMIN WITH MINERALS) tablet Take 1 tablet by mouth daily.      [provider]  Omega-3 Fatty Acids (FISH OIL) 1000 MG CAPS Take by mouth.    [provider]  pantoprazole (PROTONIX) 40 MG tablet Take 1 tablet (40 mg total) by mouth 2 (two) times daily before a meal. 03/31/17 01/01/23  Noralee Stain, DO  polyethylene glycol powder (MIRALAX) 17 GM/SCOOP powder Take 17 g by mouth in the morning and at bedtime. 06/03/23  Beatty, Celeste A, PA-C  primidone (MYSOLINE) 50 MG tablet Take 50 mg by mouth 3 (three) times daily. 05/08/17   [provider]  rosuvastatin (CRESTOR) 40 MG tablet Take 40 mg by mouth daily.    [provider]      Allergies    Atorvastatin, Diphenhydramine hcl, and Rosuvastatin    Review of Systems   Review of Systems  Physical Exam Updated Vital Signs BP (!) 145/65   Pulse 68   Temp 97.6 F (36.4 C) (Oral)   Resp 20   Ht 5\' 2"  (1.575 m)   Wt 75.8 kg   SpO2 100%   BMI 30.56 kg/m  Physical Exam Vitals and nursing note reviewed.  Constitutional:      General: She is not in acute distress.     Appearance: She is well-developed.  HENT:     Head: Normocephalic and atraumatic.     Right Ear: External ear normal.     Left Ear: External ear normal.     Nose: Nose normal.  Eyes:     Extraocular Movements: Extraocular movements intact.     Conjunctiva/sclera: Conjunctivae normal.     Pupils: Pupils are equal, round, and reactive to light.  Cardiovascular:     Rate and Rhythm: Normal rate and regular rhythm.     Heart sounds: No murmur heard. Pulmonary:     Effort: Pulmonary effort is normal. No respiratory distress.     Breath sounds: Normal breath sounds.  Abdominal:     General: Abdomen is flat. There is no distension.     Palpations: Abdomen is soft. There is no mass.     Tenderness: There is abdominal tenderness (RUQ, positive murphy's sign). There is no guarding.  Musculoskeletal:     Cervical back: Normal range of motion and neck supple.     Comments: No thoracic or lumbar step-offs.  Tenderness to palpation at approximately L1 region.  No rashes noted on flank.  No tenderness to palpation of the ribs on the right.  Skin:    General: Skin is warm and dry.  Neurological:     Mental Status: She is alert and oriented to person, place, and time. Mental status is at baseline.     Sensory: No sensory deficit.     Motor: No weakness.  Psychiatric:        Mood and Affect: Mood normal.     ED Results / Procedures / Treatments   Labs (all labs ordered are listed, but only abnormal results are displayed) Labs Reviewed - No data to display  EKG None  Radiology No results found.  Procedures Procedures  {Document cardiac monitor, telemetry assessment procedure when appropriate:1}  Medications Ordered in ED Medications - No data to display  ED Course/ Medical Decision Making/ A&P   {   Click here for ABCD2, HEART and other calculatorsREFRESH Note before signing :1}                              Medical Decision Making Amount and/or Complexity of Data  Reviewed Labs: ordered. Radiology: ordered.  Risk Prescription drug management.   ***  {Document critical care time when appropriate:1} {Document review of labs and clinical decision tools ie heart score, Chads2Vasc2 etc:1}  {Document your independent review of radiology images, and any outside records:1} {Document your discussion with family members, caretakers, and with consultants:1} {Document social determinants of health affecting pt's care:1} {Document your  decision making why or why not admission, treatments were needed:1} Final Clinical Impression(s) / ED Diagnoses Final diagnoses:  None    Rx / DC Orders ED Discharge Orders     None

## 2023-06-07 NOTE — ED Notes (Addendum)
Pt incontinent of urine at times. Pt soiled clothing. Incontinent care provided and clean linens applied to stretcher.

## 2023-06-07 NOTE — ED Notes (Signed)
Pt complains that she hasn't had anything to eat all day. Graham crackers and water provided to pt.

## 2024-02-14 ENCOUNTER — Emergency Department (HOSPITAL_COMMUNITY)
Admission: EM | Admit: 2024-02-14 | Discharge: 2024-02-15 | Disposition: A | Discharge status: Home / Self Care | Attending: Emergency Medicine | Admitting: Emergency Medicine

## 2024-02-14 ENCOUNTER — Other Ambulatory Visit: Payer: Self-pay

## 2024-02-14 DIAGNOSIS — E119 Type 2 diabetes mellitus without complications: Secondary | ICD-10-CM | POA: Diagnosis not present

## 2024-02-14 DIAGNOSIS — Z7984 Long term (current) use of oral hypoglycemic drugs: Secondary | ICD-10-CM | POA: Insufficient documentation

## 2024-02-14 DIAGNOSIS — W19XXXA Unspecified fall, initial encounter: Secondary | ICD-10-CM | POA: Insufficient documentation

## 2024-02-14 DIAGNOSIS — I10 Essential (primary) hypertension: Secondary | ICD-10-CM | POA: Diagnosis not present

## 2024-02-14 DIAGNOSIS — Z79899 Other long term (current) drug therapy: Secondary | ICD-10-CM | POA: Insufficient documentation

## 2024-02-14 DIAGNOSIS — S7001XA Contusion of right hip, initial encounter: Secondary | ICD-10-CM

## 2024-02-14 DIAGNOSIS — Y92009 Unspecified place in unspecified non-institutional (private) residence as the place of occurrence of the external cause: Secondary | ICD-10-CM | POA: Diagnosis not present

## 2024-02-14 DIAGNOSIS — M79604 Pain in right leg: Secondary | ICD-10-CM | POA: Insufficient documentation

## 2024-02-14 DIAGNOSIS — F0390 Unspecified dementia without behavioral disturbance: Secondary | ICD-10-CM | POA: Insufficient documentation

## 2024-02-14 DIAGNOSIS — S8001XA Contusion of right knee, initial encounter: Secondary | ICD-10-CM

## 2024-02-14 NOTE — ED Triage Notes (Signed)
 Pt had an unwitnessed assisted fall at home landing on her bottom. Stated, my legs just slipped from under me. Pt denies LOC. Pain is in right leg radiating to lower right leg. Denies pain elsewhere.  Pt has hx of cellulitis bilaterally in lower extremities. Swelling is seen at this time. Pt not on blood thinners.

## 2024-02-15 ENCOUNTER — Emergency Department (HOSPITAL_COMMUNITY)

## 2024-02-15 DIAGNOSIS — M79604 Pain in right leg: Secondary | ICD-10-CM | POA: Diagnosis not present

## 2024-02-15 NOTE — ED Notes (Signed)
 Pt's caregiver has been called 4x to inform her of pt being transported home.

## 2024-02-15 NOTE — ED Provider Notes (Signed)
  EMERGENCY DEPARTMENT AT [facility] Provider Note   CSN: [id number] Arrival date & time: 02/14/24  2330     Patient presents with: Leg Pain   Kendra White is a 89 y.o. female.   Patient is a 89 year old female with history of diabetes, hypertension, dementia.  Patient brought here by EMS after a fall that occurred at home.  She states that she was in her bathroom when her legs slid on the floor causing her to fall.  She reports pain in her entire right leg.  She was unable to get up, so was brought here by EMS.  She denies other injury.       Prior to Admission medications   Medication Sig Start Date End Date Taking? Authorizing Provider  ALPRAZolam  (XANAX ) 0.25 MG tablet Take 0.125-0.25 mg by mouth 3 (three) times daily as needed for anxiety.     [provider]  Biotin 5000 MCG CAPS Take by mouth daily.    [provider]  buPROPion  (WELLBUTRIN  XL) 300 MG 24 hr tablet Take 300 mg by mouth every morning.    [provider]  citalopram  (CELEXA ) 40 MG tablet Take 40 mg by mouth daily.    [provider]  donepezil  (ARICEPT ) 5 MG tablet Take 5 mg at bedtime by mouth. 05/19/17   [provider]  fenofibrate  160 MG tablet Take 160 mg by mouth daily.    [provider]  fluticasone  (FLONASE ) 50 MCG/ACT nasal spray Place 2 sprays into both nostrils daily.    [provider]  Garlic 1000 MG CAPS Take by mouth.    [provider]  HYDROcodone -acetaminophen  (NORCO/VICODIN) 5-325 MG tablet Take 1 every 6 hours for pain is not relieved by Tylenol  alone 06/07/23   Zammit, Joseph, MD  hydrOXYzine  (ATARAX ) 25 MG tablet Take 1 every 12 hours as needed for shaking 02/02/23   Suzette Pac, MD  levothyroxine  (SYNTHROID ) 50 MCG tablet Take 50 mcg by mouth daily.    [provider]  losartan -hydrochlorothiazide  (HYZAAR) 50-12.5 MG tablet Take 1 tablet by mouth daily.    [provider]   memantine  (NAMENDA ) 5 MG tablet Take 5 mg by mouth 2 (two) times daily.    [provider]  metFORMIN  (GLUCOPHAGE ) 500 MG tablet Take 500 mg by mouth at bedtime.     [provider]  Multiple Vitamins-Minerals (MULTIVITAMIN WITH MINERALS) tablet Take 1 tablet by mouth daily.      [provider]  Omega-3 Fatty Acids (FISH OIL) 1000 MG CAPS Take by mouth.    [provider]  pantoprazole  (PROTONIX ) 40 MG tablet Take 1 tablet (40 mg total) by mouth 2 (two) times daily before a meal. 03/31/17 01/01/23  Rojelio Nest, DO  polyethylene glycol powder (MIRALAX ) 17 GM/SCOOP powder Take 17 g by mouth in the morning and at bedtime. 06/03/23   Suellen Cantor A, PA-C  primidone  (MYSOLINE ) 50 MG tablet Take 50 mg by mouth 3 (three) times daily. 05/08/17   [provider]  rosuvastatin  (CRESTOR ) 40 MG tablet Take 40 mg by mouth daily.    [provider]    Allergies: Atorvastatin, Diphenhydramine hcl, and Rosuvastatin     Review of Systems  All other systems reviewed and are negative.   Updated Vital Signs BP (!) 127/46 (BP Location: Right Arm)   Pulse 71   Temp 98.2 F (36.8 C) (Oral)   Resp 18   Ht 5' 2 (1.575 m)  SpO2 96%   BMI 30.56 kg/m   Physical Exam Vitals and nursing note reviewed.  Constitutional:      General: She is not in acute distress.    Appearance: She is well-developed. She is not diaphoretic.  HENT:     Head: Normocephalic and atraumatic.  Pulmonary:     Effort: Pulmonary effort is normal.  Musculoskeletal:        General: Normal range of motion.     Cervical back: Normal range of motion and neck supple.     Comments: The right lower extremity appears grossly normal.  There is no shortening or rotation of the leg.  She does have some tenderness over the lateral aspect of the right knee, thigh, and hip, but there is no gross deformity.  DP pulses are palpable and motor and sensation are intact throughout the entire  foot and leg.  Skin:    General: Skin is warm and dry.  Neurological:     General: No focal deficit present.     Mental Status: She is alert and oriented to person, place, and time.     (all labs ordered are listed, but only abnormal results are displayed) Labs Reviewed - No data to display  EKG: None  Radiology: DG Knee Complete 4 Views Right Result Date: 02/15/2024 CLINICAL DATA:  Recent slip and fall with right knee pain, initial encounter EXAM: RIGHT KNEE - COMPLETE 4+ VIEW COMPARISON:  None Available. FINDINGS: Mild patellofemoral degenerative changes are noted. No acute fracture or dislocation is noted. No joint effusion is seen. IMPRESSION: No acute abnormality noted. Electronically Signed   By: Oneil Devonshire M.D.   On: 02/15/2024 00:53   DG Tibia/Fibula Right Result Date: 02/15/2024 CLINICAL DATA:  Recent slip and fall with right leg pain, initial encounter EXAM: RIGHT TIBIA AND FIBULA - 2 VIEW COMPARISON:  None Available. FINDINGS: No acute fracture or dislocation is noted. No soft tissue changes are seen. Vascular calcifications are noted. IMPRESSION: No acute abnormality noted. Electronically Signed   By: Oneil Devonshire M.D.   On: 02/15/2024 00:53     Procedures   Medications Ordered in the ED - No data to display                                  Medical Decision Making Amount and/or Complexity of Data Reviewed Radiology: ordered.   Patient is a 89 year old female presenting after a fall, the details of which are described in the HPI.  She is complaining of pain in the right leg.  X-rays of the knee, hip, femur, and tibia/fibula are all negative.  Patient is able to ambulate with use of a walker and I feel can safely be discharged.     Final diagnoses:  None    ED Discharge Orders     None          Geroldine Berg, MD 02/15/24 [phone]

## 2024-02-15 NOTE — Discharge Instructions (Signed)
 Weightbearing as tolerated.  Follow-up with primary doctor or return to the ER if you experience any new and/or concerning issues.

## 2024-02-15 NOTE — ED Notes (Signed)
Pt was able to ambulate with walker.

## 2024-02-17 ENCOUNTER — Encounter (HOSPITAL_COMMUNITY): Payer: Self-pay

## 2024-02-17 ENCOUNTER — Other Ambulatory Visit: Payer: Self-pay

## 2024-02-17 ENCOUNTER — Emergency Department (HOSPITAL_COMMUNITY): Admission: EM | Admit: 2024-02-17 | Discharge: 2024-02-17 | Disposition: A | Discharge status: Home / Self Care

## 2024-02-17 DIAGNOSIS — E119 Type 2 diabetes mellitus without complications: Secondary | ICD-10-CM | POA: Diagnosis not present

## 2024-02-17 DIAGNOSIS — Z8673 Personal history of transient ischemic attack (TIA), and cerebral infarction without residual deficits: Secondary | ICD-10-CM | POA: Insufficient documentation

## 2024-02-17 DIAGNOSIS — N289 Disorder of kidney and ureter, unspecified: Secondary | ICD-10-CM | POA: Insufficient documentation

## 2024-02-17 DIAGNOSIS — Z7984 Long term (current) use of oral hypoglycemic drugs: Secondary | ICD-10-CM | POA: Diagnosis not present

## 2024-02-17 DIAGNOSIS — I2510 Atherosclerotic heart disease of native coronary artery without angina pectoris: Secondary | ICD-10-CM | POA: Insufficient documentation

## 2024-02-17 DIAGNOSIS — I10 Essential (primary) hypertension: Secondary | ICD-10-CM | POA: Insufficient documentation

## 2024-02-17 DIAGNOSIS — Z79899 Other long term (current) drug therapy: Secondary | ICD-10-CM | POA: Insufficient documentation

## 2024-02-17 DIAGNOSIS — R600 Localized edema: Secondary | ICD-10-CM | POA: Insufficient documentation

## 2024-02-17 DIAGNOSIS — R531 Weakness: Secondary | ICD-10-CM

## 2024-02-17 LAB — COMPREHENSIVE METABOLIC PANEL WITH GFR
ALT: 21 U/L (ref 0–44)
AST: 33 U/L (ref 15–41)
Albumin: 3.6 g/dL (ref 3.5–5.0)
Alkaline Phosphatase: 23 U/L — ABNORMAL LOW (ref 38–126)
Anion gap: 12 (ref 5–15)
BUN: 70 mg/dL — ABNORMAL HIGH (ref 8–23)
CO2: 21 mmol/L — ABNORMAL LOW (ref 22–32)
Calcium: 9.3 mg/dL (ref 8.9–10.3)
Chloride: 102 mmol/L (ref 98–111)
Creatinine, Ser: 1.95 mg/dL — ABNORMAL HIGH (ref 0.44–1.00)
GFR, Estimated: 24 mL/min — ABNORMAL LOW (ref >60)
Glucose, Bld: 129 mg/dL — ABNORMAL HIGH (ref 70–99)
Potassium: 4.7 mmol/L (ref 3.5–5.1)
Sodium: 135 mmol/L (ref 135–145)
Total Bilirubin: 0.8 mg/dL (ref 0.0–1.2)
Total Protein: 7 g/dL (ref 6.5–8.1)

## 2024-02-17 LAB — CBC WITH DIFFERENTIAL/PLATELET
Abs Immature Granulocytes: 0.01 K/uL (ref 0.00–0.07)
Basophils Absolute: 0.1 K/uL (ref 0.0–0.1)
Basophils Relative: 1 %
Eosinophils Absolute: 0.1 K/uL (ref 0.0–0.5)
Eosinophils Relative: 2 %
HCT: 35 % — ABNORMAL LOW (ref 36.0–46.0)
Hemoglobin: 11.5 g/dL — ABNORMAL LOW (ref 12.0–15.0)
Immature Granulocytes: 0 %
Lymphocytes Relative: 28 %
Lymphs Abs: 1.6 K/uL (ref 0.7–4.0)
MCH: 31 pg (ref 26.0–34.0)
MCHC: 32.9 g/dL (ref 30.0–36.0)
MCV: 94.3 fL (ref 80.0–100.0)
Monocytes Absolute: 0.6 K/uL (ref 0.1–1.0)
Monocytes Relative: 11 %
Neutro Abs: 3.2 K/uL (ref 1.7–7.7)
Neutrophils Relative %: 58 %
Platelets: 229 K/uL (ref 150–400)
RBC: 3.71 MIL/uL — ABNORMAL LOW (ref 3.87–5.11)
RDW: 14.3 % (ref 11.5–15.5)
WBC: 5.5 K/uL (ref 4.0–10.5)
nRBC: 0 % (ref 0.0–0.2)

## 2024-02-17 NOTE — Discharge Instructions (Signed)
 Your lab tests are better today then when checked on Thursday by Dr. Gearline.  I am putting in some orders for you to be evaluated for physical therapy as discussed.

## 2024-02-17 NOTE — ED Provider Notes (Signed)
 [place] EMERGENCY DEPARTMENT AT [facility] Provider Note   CSN: [id number] Arrival date & time: 02/17/24  1055     Patient presents with: Abnormal Labs and Weakness   Kendra White is a 89 y.o. female with a history of hypertension, type 2 diabetes, CAD, history of TIA and history of acute renal insufficiency under the care of [facility], Dr. Gearline.  Patient states she received a phone call from her office today stating she had abnormal labs and she needed to go to the emergency room.  Patient has no other detailed information regarding which labs specifically were abnormal.  She has no acute symptoms today but states she has had generalized significant weakness ever since she fractured her lumbar spine but cannot tell me when this occurred.  Review of recent imaging suggest an old injury, at least since I am unable to find any imaging in our system with a documented lumbar fracture.  Pt discussed with Dr. Melia with [facility] - creatinine 2.88 on 7/31, up from 2.2 5/12,  creatinine 4.8,  Co2 21.  Dr. Macel on call for [facility].    The history is provided by the patient.       Prior to Admission medications   Medication Sig Start Date End Date Taking? Authorizing Provider  ALPRAZolam  (XANAX ) 0.25 MG tablet Take 0.125-0.25 mg by mouth 3 (three) times daily as needed for anxiety.    Yes [provider]  B Complex-Biotin-FA (SUPER B-COMPLEX) TABS Take 1 tablet by mouth daily.   Yes [provider]  Biotin 5000 MCG CAPS Take by mouth daily.   Yes [provider]  buPROPion  (WELLBUTRIN  XL) 300 MG 24 hr tablet Take 300 mg by mouth every morning.   Yes [provider]  cephALEXin (KEFLEX) 500 MG capsule Take 500 mg by mouth 3 (three) times daily. 10 day course starting on 02/07/2024 02/07/24 02/18/24 Yes [provider]  citalopram  (CELEXA ) 40 MG tablet Take 40 mg by mouth daily.   Yes [provider]  donepezil  (ARICEPT ) 5 MG  tablet Take 5 mg at bedtime by mouth. 05/19/17  Yes [provider]  fluticasone  (FLONASE ) 50 MCG/ACT nasal spray Place 2 sprays into both nostrils daily.   Yes [provider]  furosemide  (LASIX ) 20 MG tablet Take 20 mg by mouth daily.   Yes [provider]  glipiZIDE (GLUCOTROL XL) 2.5 MG 24 hr tablet Take 2.5 mg by mouth daily.   Yes [provider]  HYDROcodone -acetaminophen  (NORCO/VICODIN) 5-325 MG tablet Take 1 every 6 hours for pain is not relieved by Tylenol  alone 06/07/23  Yes Zammit, Joseph, MD  hydrOXYzine  (ATARAX ) 25 MG tablet Take 1 every 12 hours as needed for shaking Patient taking differently: Take 25 mg by mouth every 12 (twelve) hours as needed for anxiety. Take 1 every 12 hours as needed for shaking 02/02/23  Yes Suzette Pac, MD  levothyroxine  (SYNTHROID ) 50 MCG tablet Take 50 mcg by mouth daily.   Yes [provider]  losartan  (COZAAR ) 50 MG tablet Take 50 mg by mouth daily. 02/15/24  Yes [provider]  memantine  (NAMENDA ) 5 MG tablet Take 5 mg by mouth 2 (two) times daily.   Yes [provider]  metFORMIN  (GLUCOPHAGE ) 500 MG tablet Take 500 mg by mouth at bedtime.    Yes [provider]  metoprolol succinate (TOPROL-XL) 50 MG 24 hr tablet Take 50 mg by mouth daily. 02/15/24  Yes [provider]  Multiple Vitamins-Minerals (MULTIVITAMIN WITH MINERALS) tablet Take 1 tablet by mouth daily.     Yes [provider]  Omega-3 Fatty Acids (FISH OIL) 1000 MG CAPS Take by mouth.   Yes [provider]  polyethylene glycol powder (MIRALAX ) 17 GM/SCOOP powder Take 17 g by mouth in the morning and at bedtime. 06/03/23  Yes Beatty, Celeste A, PA-C  primidone  (MYSOLINE ) 50 MG tablet Take 50 mg by mouth 3 (three) times daily. 05/08/17  Yes [provider]  rosuvastatin  (CRESTOR ) 40 MG tablet Take 40 mg by mouth daily.   Yes [provider]  torsemide (DEMADEX) 10 MG tablet Take  10 mg by mouth daily. 02/07/24  Yes [provider]  losartan -hydrochlorothiazide  (HYZAAR) 50-12.5 MG tablet Take 1 tablet by mouth daily.    [provider]    Allergies: Atorvastatin, Diphenhydramine hcl, and Rosuvastatin     Review of Systems  Constitutional:  Negative for chills and fever.  HENT:  Negative for congestion and sore throat.   Eyes: Negative.   Respiratory:  Negative for chest tightness and shortness of breath.   Cardiovascular:  Negative for chest pain.  Gastrointestinal:  Negative for abdominal pain, nausea and vomiting.  Genitourinary: Negative.   Musculoskeletal:  Negative for arthralgias, joint swelling and neck pain.  Skin: Negative.  Negative for rash and wound.  Neurological:  Positive for weakness. Negative for dizziness, light-headedness, numbness and headaches.  Psychiatric/Behavioral: Negative.      Updated Vital Signs BP (!) 115/54   Pulse 67   Temp 97.9 F (36.6 C) (Oral)   Resp 18   SpO2 92%   Physical Exam Vitals and nursing note reviewed.  Constitutional:      Appearance: She is well-developed.  HENT:     Head: Normocephalic and atraumatic.  Eyes:     Conjunctiva/sclera: Conjunctivae normal.  Cardiovascular:     Rate and Rhythm: Normal rate and regular rhythm.     Heart sounds: Normal heart sounds.  Pulmonary:     Effort: Pulmonary effort is normal.     Breath sounds: Normal breath sounds. No wheezing.  Abdominal:     General: Bowel sounds are normal.     Palpations: Abdomen is soft.     Tenderness: There is no abdominal tenderness.  Musculoskeletal:        General: Normal range of motion.     Cervical back: Normal range of motion.     Right lower leg: Edema present.     Left lower leg: Edema present.  Skin:    General: Skin is warm and dry.  Neurological:     General: No focal deficit present.     Mental Status: She is alert.     Motor: Weakness present.     Comments: Generalized weakness.      (all labs  ordered are listed, but only abnormal results are displayed) Labs Reviewed  CBC WITH DIFFERENTIAL/PLATELET - Abnormal; Notable for the following components:      Result Value   RBC 3.71 (*)    Hemoglobin 11.5 (*)    HCT 35.0 (*)    All other components within normal limits  COMPREHENSIVE METABOLIC PANEL WITH GFR - Abnormal; Notable for the following components:   CO2 21 (*)    Glucose, Bld 129 (*)    BUN 70 (*)    Creatinine, Ser 1.95 (*)    Alkaline Phosphatase 23 (*)    GFR, Estimated 24 (*)    All other components within normal  limits    EKG: None  Radiology: No results found.   Procedures   Medications Ordered in the ED - No data to display                                  Medical Decision Making Patient presents for reevaluation of kidney functions, stating she had abnormal lab test when seen by her nephrologist 2 days ago and was contacted and advised to get rechecked.  She has no new specific complaints, she does endorse generalized weakness which is a chronic finding, she is typically either wheelchair-bound although can ambulate short distances using a walker but usually either requires assistance in order to stand or by use of her chairlift.  She does have it in-home caregiver 8 hours during the week and 4 hours on Saturday and has a daughter that lives next-door that helps her at other times.  She does require assistance here getting from her wheelchair to the bed in her room.  She states she is undergoing home physical therapy months ago which was somewhat helpful.  She is desirous of additional physical therapy if she is eligible for this.  She does feel safe going home, we briefly discussed inpatient rehab as an alternative but patient is not interested in this at this time.  Labs as outlined below revealing for an improved creatinine from her baseline.  This was discussed with Dr. Macel who concurs no further action from a kidney standpoint.  A face-to-face home PT  evaluation was ordered.  Amount and/or Complexity of Data Reviewed Labs: ordered.    Details: Significant labs creatinine 1.95 improved from 2.2 and 2.88 recently obtained.  Hemoglobin of 11.5 which is baseline. Discussion of management or test interpretation with external provider(s): Discussed with Dr Macel via secure chat.   Risk Decision regarding hospitalization.        Final diagnoses:  Renal insufficiency  Generalized weakness    ED Discharge Orders          Ordered    Home Health        02/17/24 1651    Face-to-face encounter (required for Medicare/Medicaid patients)       Comments: I Mliss Narrow certify that this patient is under my care and that I, or a nurse practitioner or physician's assistant working with me, had a face-to-face encounter that meets the physician face-to-face encounter requirements with this patient on 02/17/2024. The encounter with the patient was in whole, or in part for the following medical condition(s) which is the primary reason for home health care (List medical condition): Pt has reducing mobility due to chronic medical conditions and has deconditioning and generalized weakness.  She has a lift chair in her home which allows her to stand and use her walker,  but having increasing difficulty with ambulation.    Has home care provider 8 hours 5 days/week   02/17/24 1651               Narrow Mliss, PA-C 02/17/24 1744    Simon Lavonia SAILOR, MD 02/18/24 [phone]

## 2024-02-17 NOTE — ED Triage Notes (Addendum)
 Pt from home pt states that she got a call and was told to come back to get more work done. Pt's caregivers accompanied her today. Pt aaox4. VSS and denies acute pain. Pt endorses weakness and mild arm strain. Pt also states that she fell on Tuesday

## 2024-02-17 NOTE — ED Notes (Signed)
 Pt was asking for crackers, MD suggests waiting until lab work results before eating anything.
